# Patient Record
Sex: Female | Born: 1990
Health system: Southern US, Community
[De-identification: ages and names within clinical notes are randomized; demographics above are authoritative.]

## PROBLEM LIST (undated history)

## (undated) ENCOUNTER — Inpatient Hospital Stay (HOSPITAL_COMMUNITY): Payer: Self-pay

## (undated) DIAGNOSIS — C801 Malignant (primary) neoplasm, unspecified: Secondary | ICD-10-CM

## (undated) DIAGNOSIS — K279 Peptic ulcer, site unspecified, unspecified as acute or chronic, without hemorrhage or perforation: Secondary | ICD-10-CM

## (undated) DIAGNOSIS — K6389 Other specified diseases of intestine: Secondary | ICD-10-CM

## (undated) DIAGNOSIS — K635 Polyp of colon: Secondary | ICD-10-CM

## (undated) DIAGNOSIS — Z800 Family history of malignant neoplasm of digestive organs: Secondary | ICD-10-CM

## (undated) HISTORY — PX: HERNIA REPAIR: SHX51

## (undated) HISTORY — PX: TUBAL LIGATION: SHX77

## (undated) HISTORY — DX: Family history of malignant neoplasm of digestive organs: Z80.0

## (undated) HISTORY — DX: Polyp of colon: K63.5

## (undated) HISTORY — PX: APPENDECTOMY: SHX54

## (undated) HISTORY — DX: Other specified diseases of intestine: K63.89

## (undated) HISTORY — PX: ABDOMINAL HYSTERECTOMY: SHX81

## (undated) HISTORY — PX: COLON SURGERY: SHX602

---

## 2012-12-20 ENCOUNTER — Other Ambulatory Visit: Payer: Self-pay

## 2013-06-13 ENCOUNTER — Other Ambulatory Visit (HOSPITAL_COMMUNITY): Payer: Self-pay | Admitting: Specialist

## 2013-06-13 DIAGNOSIS — IMO0002 Reserved for concepts with insufficient information to code with codable children: Secondary | ICD-10-CM

## 2013-06-19 ENCOUNTER — Other Ambulatory Visit (HOSPITAL_COMMUNITY): Payer: Self-pay | Admitting: Specialist

## 2013-06-19 ENCOUNTER — Encounter (HOSPITAL_COMMUNITY): Payer: Self-pay

## 2013-06-19 ENCOUNTER — Ambulatory Visit (HOSPITAL_COMMUNITY)
Admission: RE | Admit: 2013-06-19 | Discharge: 2013-06-19 | Disposition: A | Discharge status: Home / Self Care | Payer: Medicaid Other | Source: Ambulatory Visit | Attending: Specialist | Admitting: Specialist

## 2013-06-19 ENCOUNTER — Inpatient Hospital Stay (HOSPITAL_COMMUNITY)
Admission: AD | Admit: 2013-06-19 | Discharge: 2013-06-19 | Disposition: A | Discharge status: Home / Self Care | Payer: Medicaid Other | Source: Ambulatory Visit | Attending: Obstetrics & Gynecology | Admitting: Obstetrics & Gynecology

## 2013-06-19 VITALS — BP 98/69 | HR 68 | Wt 131.5 lb

## 2013-06-19 DIAGNOSIS — Z3689 Encounter for other specified antenatal screening: Secondary | ICD-10-CM | POA: Insufficient documentation

## 2013-06-19 DIAGNOSIS — IMO0002 Reserved for concepts with insufficient information to code with codable children: Secondary | ICD-10-CM

## 2013-06-19 DIAGNOSIS — O365990 Maternal care for other known or suspected poor fetal growth, unspecified trimester, not applicable or unspecified: Secondary | ICD-10-CM | POA: Insufficient documentation

## 2013-06-19 NOTE — MAU Note (Signed)
Pt in MAU by mistake, has appointment in [place].

## 2013-06-25 ENCOUNTER — Ambulatory Visit (HOSPITAL_COMMUNITY)
Admission: RE | Admit: 2013-06-25 | Discharge: 2013-06-25 | Disposition: A | Discharge status: Home / Self Care | Payer: Medicaid Other | Source: Ambulatory Visit | Attending: Maternal and Fetal Medicine | Admitting: Maternal and Fetal Medicine

## 2013-06-25 DIAGNOSIS — O365990 Maternal care for other known or suspected poor fetal growth, unspecified trimester, not applicable or unspecified: Secondary | ICD-10-CM | POA: Insufficient documentation

## 2013-06-25 DIAGNOSIS — IMO0002 Reserved for concepts with insufficient information to code with codable children: Secondary | ICD-10-CM

## 2013-07-02 ENCOUNTER — Other Ambulatory Visit (HOSPITAL_COMMUNITY): Payer: Self-pay | Admitting: Maternal and Fetal Medicine

## 2013-07-02 DIAGNOSIS — O365990 Maternal care for other known or suspected poor fetal growth, unspecified trimester, not applicable or unspecified: Secondary | ICD-10-CM

## 2013-07-03 ENCOUNTER — Ambulatory Visit (HOSPITAL_COMMUNITY)
Admission: RE | Admit: 2013-07-03 | Discharge: 2013-07-03 | Disposition: A | Discharge status: Home / Self Care | Payer: Medicaid Other | Source: Ambulatory Visit | Attending: Specialist | Admitting: Specialist

## 2013-07-03 DIAGNOSIS — O365990 Maternal care for other known or suspected poor fetal growth, unspecified trimester, not applicable or unspecified: Secondary | ICD-10-CM | POA: Insufficient documentation

## 2014-02-03 ENCOUNTER — Encounter (HOSPITAL_COMMUNITY): Payer: Self-pay

## 2014-04-24 ENCOUNTER — Encounter (HOSPITAL_COMMUNITY): Payer: Self-pay | Admitting: *Deleted

## 2015-08-18 IMAGING — US US OB COMP +14 WK
1 series · 12 of 28 positions shown · non-contrast
Comparison: none

[Series 1: us ob comp +14 wk · 12 of 70 slices shown]
[im 3/70]
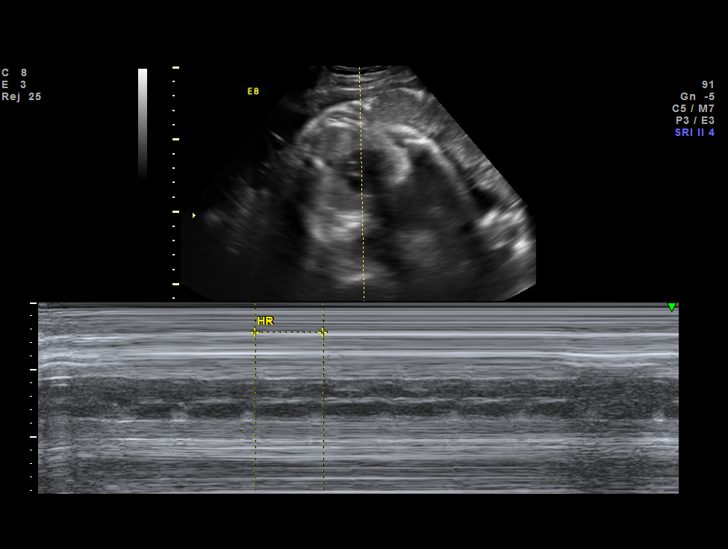
[im 8/70]
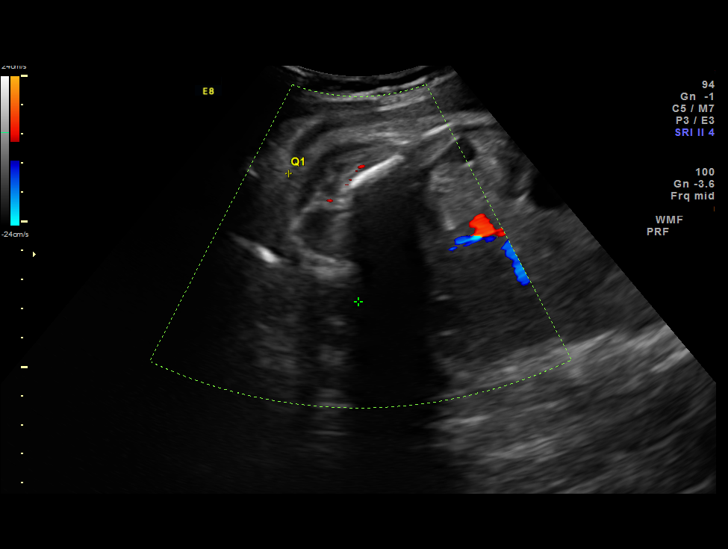
[im 13/70]
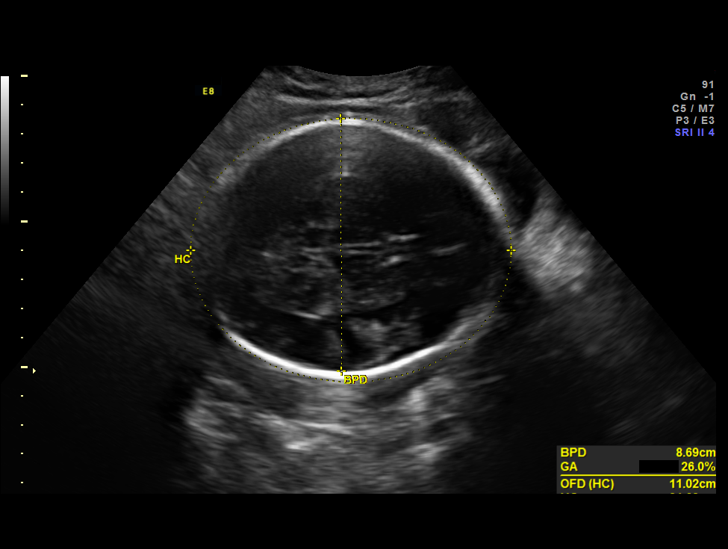
[im 21/70]
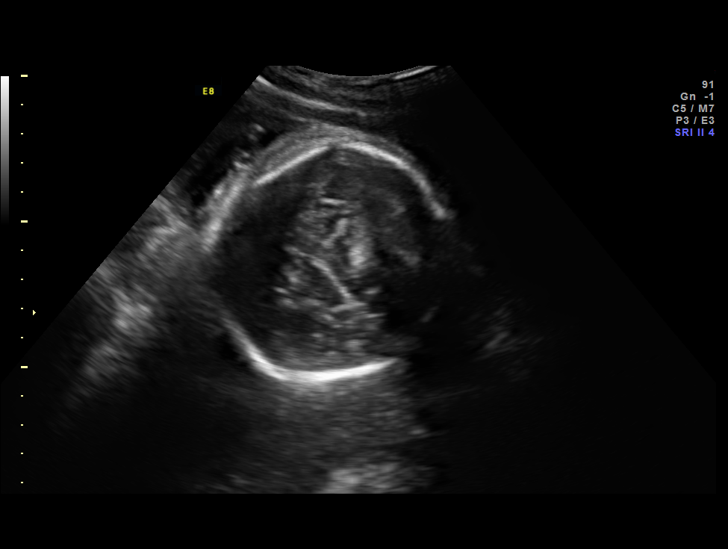
[im 26/70]
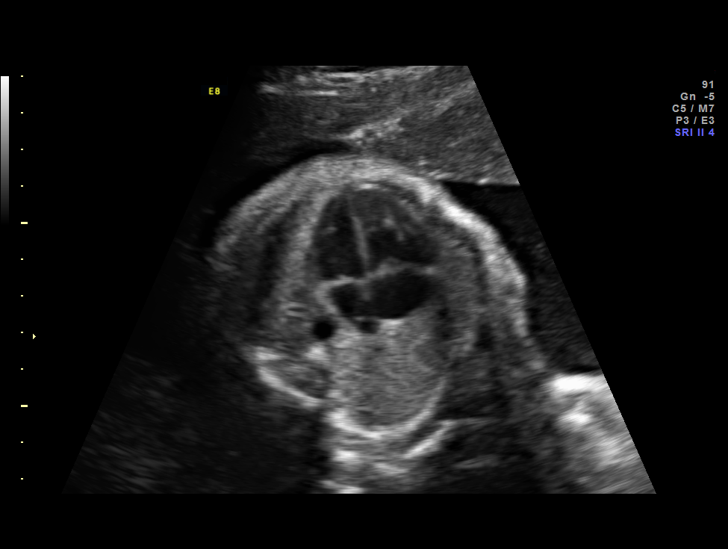
[im 31/70]
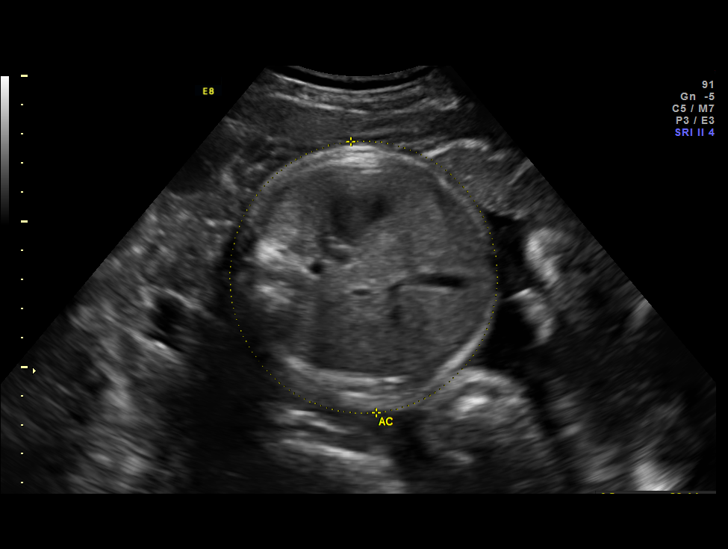
[im 39/70]
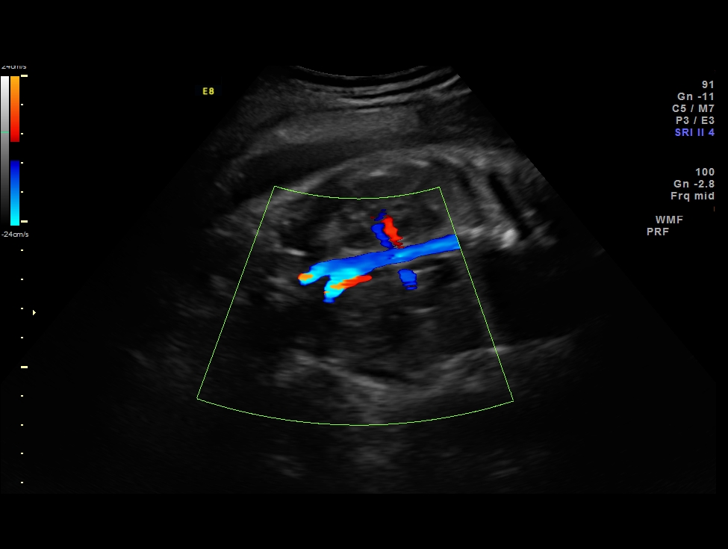
[im 44/70]
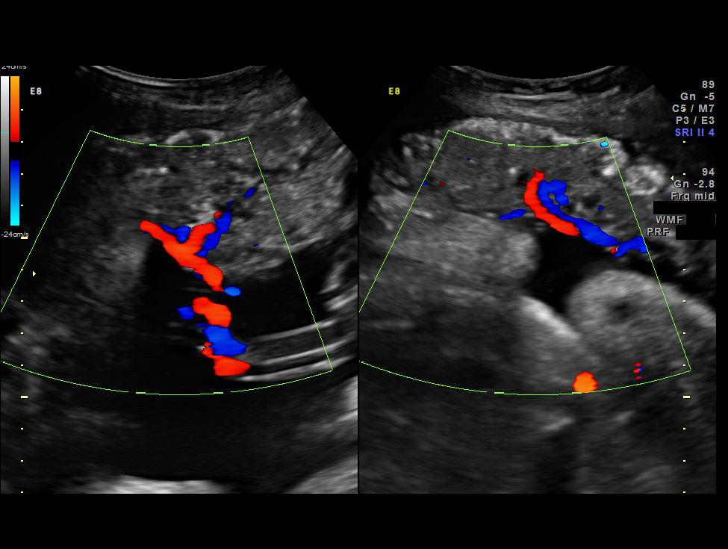
[im 49/70]
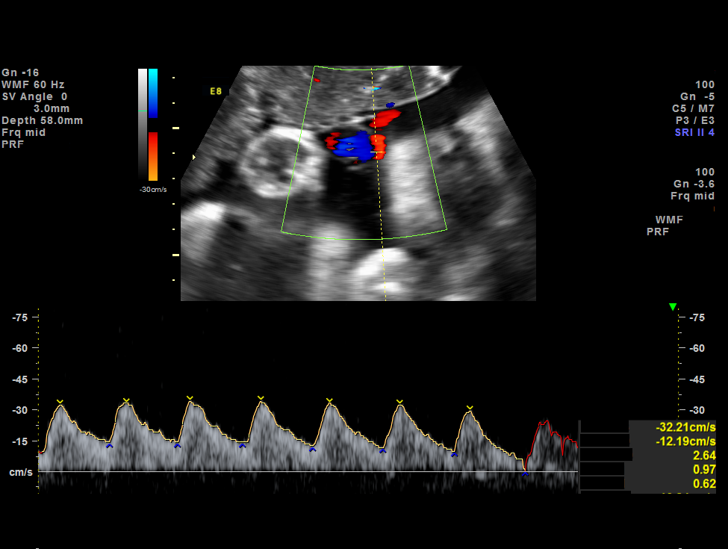
[im 57/70]
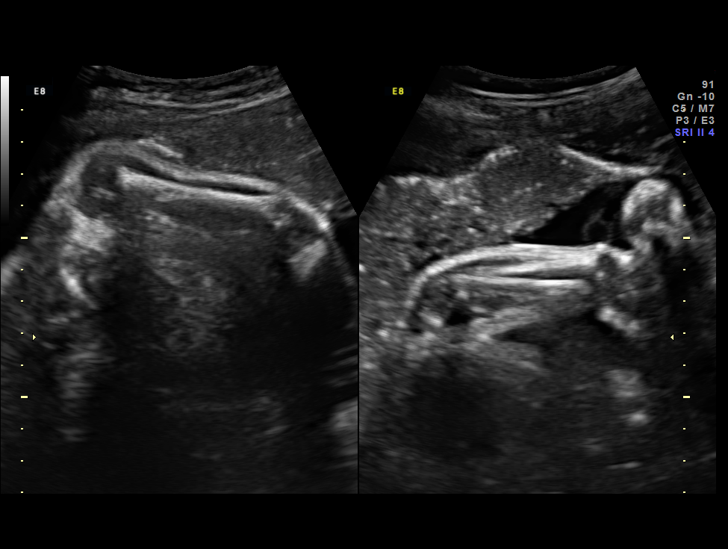
[im 62/70]
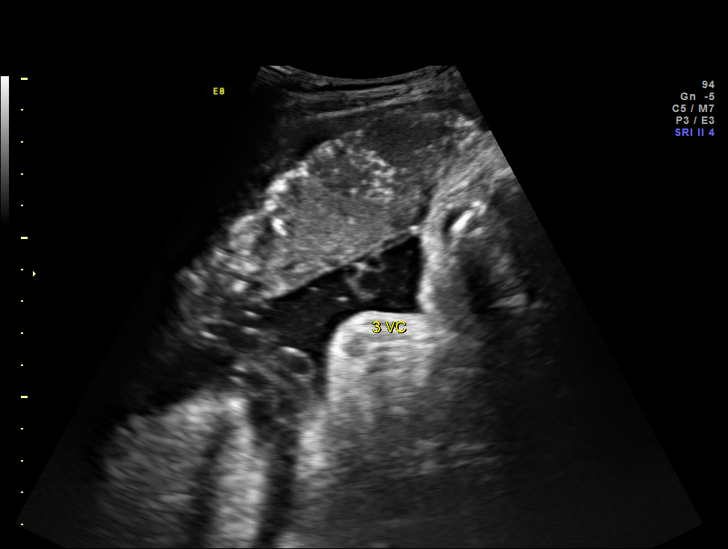
[im 67/70]
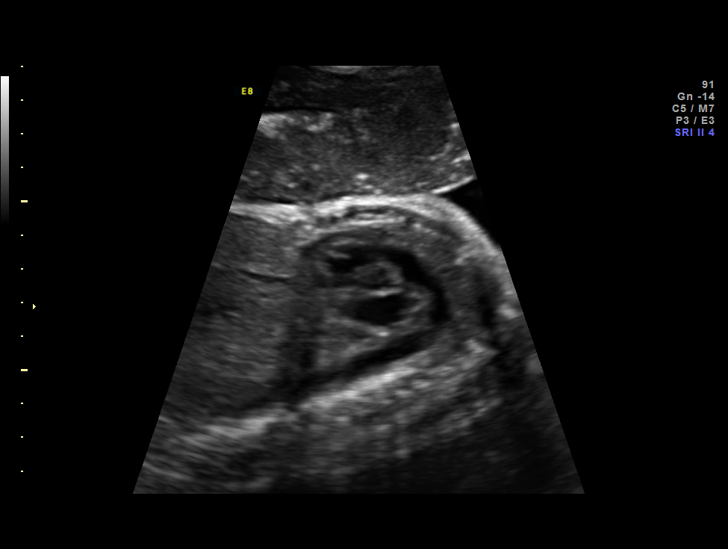

[12 of 28 positions shown; findings below may reference images not displayed]

OBSTETRICS REPORT
                      (Signed Final 06/19/2013 [DATE])

Service(s) Provided

 US OB COMP + 14 WK                                    76805.1
 US UA CORD DOPPLER                                    76820.0
Indications

 Intrauterine Growth Restriction
 Assess fetal well-being
 Basic anatomic survey
Fetal Evaluation

 Num Of Fetuses:    1
 Fetal Heart Rate:  155                          bpm
 Cardiac Activity:  Observed
 Presentation:      Cephalic
 Placenta:          Anterior, above cervical os
 P. Cord            Visualized
 Insertion:

 Amniotic Fluid
 AFI FV:      Subjectively within normal limits
 AFI Sum:     17.76   cm       66  %Tile     Larg Pckt:    9.33  cm
 RUQ:   0.03    cm   RLQ:    3.92   cm    LUQ:   9.33    cm   LLQ:    4.48   cm
Biophysical Evaluation

 Amniotic F.V:   Pocket => 2 cm two         F. Tone:        Observed
                 planes
 F. Movement:    Observed                   Score:          [DATE]
 F. Breathing:   Observed
Biometry

 BPD:     86.9  mm     G. Age:  35w 0d                CI:         79.2   70 - 86
 OFD:    109.7  mm                                    FL/HC:      19.7   20.1 -

 HC:     313.9  mm     G. Age:  35w 1d        7  %    HC/AC:      1.08   0.93 -

 AC:     291.6  mm     G. Age:  33w 1d      < 3  %    FL/BPD:     71.1   71 - 87
 FL:      61.8  mm     G. Age:  32w 0d      < 3  %    FL/AC:      21.2   20 - 24
 HUM:     52.9  mm     G. Age:  30w 6d      < 5  %

 Est. FW:    0611  gm    4 lb 12 oz    < 10  %
Gestational Age

 LMP:           36w 2d        Date:  10/08/12                 EDD:   07/15/13
 U/S Today:     33w 6d                                        EDD:   08/01/13
 Best:          36w 2d     Det. By:  LMP  (10/08/12)          EDD:   07/15/13
Anatomy

 Cranium:          Appears normal         Aortic Arch:      Appears normal
 Fetal Cavum:      Appears normal         Ductal Arch:      Appears normal
 Ventricles:       Appears normal         Diaphragm:        Appears normal
 Choroid Plexus:   Appears normal         Stomach:          Appears normal, left
                                                            sided
 Cerebellum:       Not well visualized    Abdomen:          Appears normal
 Posterior Fossa:  Not well visualized    Abdominal Wall:   Not well visualized
 Nuchal Fold:      Not applicable (>20    Cord Vessels:     Appears normal (3
                   wks GA)                                  vessel cord)
 Face:             Appears normal         Kidneys:          Appear normal
                   (orbits and profile)
 Lips:             Appears normal         Bladder:          Appears normal
 Heart:            Appears normal         Spine:            Not well visualized
                   (4CH, axis, and
                   situs)
 RVOT:             Not well visualized    Lower             Visualized
                                          Extremities:
 LVOT:             Appears normal         Upper             Visualized
                                          Extremities:

 Other:  Fetus appears to be a female. Technically difficult due to advanced
         GA.
Targeted Anatomy

 Fetal Central Nervous System
 Lat. Ventricles:
Doppler - Fetal Vessels

 Umbilical Artery
 S/D:   2.8            70  %tile       RI:
 PI:    0.9                            PSV:       37.28   cm/s
 Umbilical Artery
 Absent DFV:    No     Reverse DFV:    No

Cervix Uterus Adnexa

 Cervix:       Not visualized (advanced GA >34 wks)

 Left Ovary:    Within normal limits.
 Right Ovary:   Within normal limits.
 Adnexa:     No abnormality visualized.
Impression

 Single IUP at 36 [DATE] weeks, confirmed by early ultrasound
 Normal fetal anatomic survey, although somewhat limited due
 to late gestational age
 Fetal growth restriction noted with an EFW < 10th %tile.
 UA Doppler studies normal for gestational age
 BPP [DATE]
 Normal amniotic fluid volume
Recommendations

 Recommend 2x weekly NSTs with weekly AFIs
 Patient to follow up next week for UA Doppler studies
 If testing remains reassuring, recommend delivery at 38
 weeks gestation.

 questions or concerns.

## 2018-03-23 ENCOUNTER — Other Ambulatory Visit: Payer: Self-pay

## 2018-03-23 ENCOUNTER — Emergency Department (HOSPITAL_BASED_OUTPATIENT_CLINIC_OR_DEPARTMENT_OTHER)
Admission: EM | Admit: 2018-03-23 | Discharge: 2018-03-23 | Disposition: A | Discharge status: Home / Self Care | Payer: Self-pay | Attending: Emergency Medicine | Admitting: Emergency Medicine

## 2018-03-23 ENCOUNTER — Encounter (HOSPITAL_BASED_OUTPATIENT_CLINIC_OR_DEPARTMENT_OTHER): Payer: Self-pay | Admitting: *Deleted

## 2018-03-23 DIAGNOSIS — R69 Illness, unspecified: Secondary | ICD-10-CM

## 2018-03-23 DIAGNOSIS — J069 Acute upper respiratory infection, unspecified: Secondary | ICD-10-CM | POA: Insufficient documentation

## 2018-03-23 DIAGNOSIS — B9789 Other viral agents as the cause of diseases classified elsewhere: Secondary | ICD-10-CM

## 2018-03-23 DIAGNOSIS — J111 Influenza due to unidentified influenza virus with other respiratory manifestations: Secondary | ICD-10-CM | POA: Insufficient documentation

## 2018-03-23 MED ORDER — KETOROLAC TROMETHAMINE 30 MG/ML IJ SOLN
30.0000 mg | Freq: Once | INTRAMUSCULAR | Status: AC
  Administered 2018-03-23: 30 mg via INTRAVENOUS
  Filled 2018-03-23: qty 1

## 2018-03-23 MED ORDER — SODIUM CHLORIDE 0.9 % IV BOLUS
1000.0000 mL | Freq: Once | INTRAVENOUS | Status: AC
  Administered 2018-03-23: 1000 mL via INTRAVENOUS

## 2018-03-23 MED ORDER — ACETAMINOPHEN-CODEINE 120-12 MG/5ML PO SOLN: 10.0000 mL | ORAL | 0 refills | Status: DC | PRN

## 2018-03-23 MED ORDER — IBUPROFEN 800 MG PO TABS: 800.0000 mg | ORAL_TABLET | Freq: Three times a day (TID) | ORAL | 0 refills | Status: DC | PRN

## 2018-03-23 MED ORDER — GUAIFENESIN ER 1200 MG PO TB12: 1.0000 | ORAL_TABLET | Freq: Two times a day (BID) | ORAL | 0 refills | Status: DC

## 2018-03-23 MED FILL — IBUPROFEN 800 MG TAB: 800 | 7 days supply | Qty: 21 | Fill #0

## 2018-03-23 MED FILL — SM MUCUS RELIEF 600 MG TB12: 600 | 10 days supply | Qty: 40 | Fill #0

## 2018-03-23 MED FILL — ACETAMINOPHEN-CODEINE ELX: 120-12 | 3 days supply | Qty: 120 | Fill #0

## 2018-03-23 NOTE — ED Provider Notes (Signed)
[facility] EMERGENCY DEPARTMENT Provider Note   CSN: [id number] Arrival date & time: 03/23/18  1018     History   Chief Complaint Chief Complaint  Patient presents with  . Cough    HPI Briana Powers is a 27 y.o. female.  HPI  Patient presents to the emergency department with cough, nasal congestion, body aches, chills and fatigue.  The patient states the symptoms started 3 days ago.  She states that nothing seems to make the condition better or worse.  She states she did not take any medications prior to arrival for her symptoms.  The patient denies chest pain, shortness of breath, headache,blurred vision, neck pain, fever,  weakness, numbness, dizziness, anorexia, edema, abdominal pain, nausea, vomiting, diarrhea, rash, back pain, dysuria, hematemesis, bloody stool, near syncope, or syncope. History reviewed. No pertinent past medical history.  There are no active problems to display for this patient.   History reviewed. No pertinent surgical history.   OB History    Gravida  2   Para  1   Term  1   Preterm  0   AB  0   Living  1     SAB  0   TAB  0   Ectopic  0   Multiple  0   Live Births               Home Medications    Prior to Admission medications   Not on File    Family History History reviewed. No pertinent family history.  Social History Social History   Tobacco Use  . Smoking status: Never Smoker  . Smokeless tobacco: Never Used  Substance Use Topics  . Alcohol use: Not on file  . Drug use: Not on file     Allergies   Patient has no known allergies.   Review of Systems Review of Systems All other systems negative except as documented in the HPI. All pertinent positives and negatives as reviewed in the HPI.  Physical Exam Updated Vital Signs BP 107/70 (BP Location: Left Arm)   Pulse 80   Resp 20   Ht 5\' 5"  (1.651 m)   Wt 50.8 kg   LMP 03/22/2018   SpO2 100%   BMI 18.64 kg/m   Physical Exam Vitals  signs and nursing note reviewed.  Constitutional:      General: She is not in acute distress.    Appearance: She is well-developed.  HENT:     Head: Normocephalic and atraumatic.     Right Ear: Tympanic membrane normal.     Left Ear: Tympanic membrane normal.     Nose: Congestion present.     Mouth/Throat:     Mouth: Mucous membranes are moist.     Pharynx: No oropharyngeal exudate or posterior oropharyngeal erythema.  Eyes:     Pupils: Pupils are equal, round, and reactive to light.  Neck:     Musculoskeletal: Normal range of motion and neck supple.  Cardiovascular:     Rate and Rhythm: Normal rate and regular rhythm.     Heart sounds: Normal heart sounds. No murmur. No friction rub. No gallop.   Pulmonary:     Effort: Pulmonary effort is normal. No respiratory distress.     Breath sounds: Normal breath sounds. No wheezing or rhonchi.  Skin:    General: Skin is warm and dry.     Capillary Refill: Capillary refill takes less than 2 seconds.     Findings: No erythema  or rash.  Neurological:     Mental Status: She is alert and oriented to person, place, and time.     Motor: No abnormal muscle tone.     Coordination: Coordination normal.  Psychiatric:        Behavior: Behavior normal.      ED Treatments / Results  Labs (all labs ordered are listed, but only abnormal results are displayed) Labs Reviewed - No data to display  EKG None  Radiology No results found.  Procedures Procedures (including critical care time)  Medications Ordered in ED Medications  sodium chloride 0.9 % bolus 1,000 mL ( Intravenous Stopped 03/23/18 1326)  ketorolac (TORADOL) 30 MG/ML injection 30 mg (30 mg Intravenous Given 03/23/18 1223)     Initial Impression / Assessment and Plan / ED Course  I have reviewed the triage vital signs and the nursing notes.  Pertinent labs & imaging results that were available during my care of the patient were reviewed by me and considered in my medical  decision making (see chart for details).     Patient be treated for an influenza-like illness.  Told to return here as needed.  Tylenol and Motrin for fever and body aches.  Told to return for any worsening in her condition.  Have advised to follow-up with a primary doctor as well.  Told to increase her fluid intake and rest as much as possible. As much as po able ss   Final diagnoses:  None   To increase her fluid intake and rest ED Discharge Orders    None       Dalia Heading, PA-C 03/23/18 1346    Quintella Reichert, MD 03/23/18 [phone]

## 2018-03-23 NOTE — Discharge Instructions (Addendum)
Return here as needed.  Follow-up with your primary doctor.  Increase your fluid intake.  Rest as much as possible.

## 2018-03-23 NOTE — ED Triage Notes (Signed)
Pt reports cough, congestion x 2 days, body aches also.

## 2019-02-04 ENCOUNTER — Emergency Department (HOSPITAL_BASED_OUTPATIENT_CLINIC_OR_DEPARTMENT_OTHER)
Admission: EM | Admit: 2019-02-04 | Discharge: 2019-02-04 | Disposition: A | Discharge status: Home / Self Care | Payer: 59 | Attending: Emergency Medicine | Admitting: Emergency Medicine

## 2019-02-04 ENCOUNTER — Other Ambulatory Visit: Payer: Self-pay

## 2019-02-04 ENCOUNTER — Encounter (HOSPITAL_BASED_OUTPATIENT_CLINIC_OR_DEPARTMENT_OTHER): Payer: Self-pay

## 2019-02-04 DIAGNOSIS — X58XXXA Exposure to other specified factors, initial encounter: Secondary | ICD-10-CM | POA: Diagnosis not present

## 2019-02-04 DIAGNOSIS — S025XXA Fracture of tooth (traumatic), initial encounter for closed fracture: Secondary | ICD-10-CM | POA: Diagnosis not present

## 2019-02-04 DIAGNOSIS — Y929 Unspecified place or not applicable: Secondary | ICD-10-CM | POA: Diagnosis not present

## 2019-02-04 DIAGNOSIS — Y939 Activity, unspecified: Secondary | ICD-10-CM | POA: Insufficient documentation

## 2019-02-04 DIAGNOSIS — Y999 Unspecified external cause status: Secondary | ICD-10-CM | POA: Diagnosis not present

## 2019-02-04 DIAGNOSIS — K0889 Other specified disorders of teeth and supporting structures: Secondary | ICD-10-CM | POA: Diagnosis present

## 2019-02-04 MED ORDER — PENICILLIN V POTASSIUM 500 MG PO TABS: 500.0000 mg | ORAL_TABLET | Freq: Four times a day (QID) | ORAL | 0 refills | Status: AC

## 2019-02-04 NOTE — ED Provider Notes (Signed)
[facility] EMERGENCY DEPARTMENT Provider Note   CSN: [id number] Arrival date & time: 02/04/19  1931     History   Chief Complaint Chief Complaint  Patient presents with  . Dental Pain    HPI Briana Powers is a 28 y.o. female who presents to the ED today complaining of gradual onset, constant, achy, right lower dental pain x 2 weeks.  She endorses history of fractured tooth in the area which intermittently gives her problems.  She reports that she has been taking 400 mg ibuprofen every 8 hours as needed for pain with mild relief but reports the pain was so severe tonight prompting her to come to the ED.  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  No foul taste in mouth.  Denies fever, chills, trismus, drooling, inability to tolerate secretions, ear pain, any other associated symptoms.      History reviewed. No pertinent past medical history.  There are no active problems to display for this patient.   Past Surgical History:  Procedure Laterality Date  . APPENDECTOMY    . HERNIA REPAIR    . TUBAL LIGATION       OB History    Gravida  2   Para  1   Term  1   Preterm  0   AB  0   Living  1     SAB  0   TAB  0   Ectopic  0   Multiple  0   Live Births               Home Medications    Prior to Admission medications   Medication Sig Start Date End Date Taking? Authorizing Provider  acetaminophen-codeine 120-12 MG/5ML solution Take 10 mLs by mouth every 4 (four) hours as needed for moderate pain. 03/23/18   Lawyer, Harrell Gave, PA-C  Guaifenesin 1200 MG TB12 Take 1 tablet (1,200 mg total) by mouth 2 (two) times daily. 03/23/18   Lawyer, Harrell Gave, PA-C  ibuprofen (ADVIL,MOTRIN) 800 MG tablet Take 1 tablet (800 mg total) by mouth every 8 (eight) hours as needed. 03/23/18   Lawyer, Harrell Gave, PA-C  penicillin v  potassium (VEETID) 500 MG tablet Take 1 tablet (500 mg total) by mouth 4 (four) times daily for 7 days. 02/04/19 02/11/19  Eustaquio Maize, PA-C    Family History History reviewed. No pertinent family history.  Social History Social History   Tobacco Use  . Smoking status: Never Smoker  . Smokeless tobacco: Never Used  Substance Use Topics  . Alcohol use: Not Currently  . Drug use: Not on file     Allergies   Patient has no known allergies.   Review of Systems Review of Systems  Constitutional: Negative for chills and fever.  HENT: Positive for dental problem. Negative for drooling, ear discharge, ear pain and tinnitus.      Physical Exam Updated Vital Signs BP 98/78 (BP Location: Right Arm)   Pulse 82   Temp 98.4 F (36.9 C) (Oral)   Resp 17   Ht 5\' 5"  (1.651 m)   Wt 50.8 kg   SpO2 100%   BMI 18.64 kg/m   Physical Exam Vitals signs and nursing note reviewed.  Constitutional:      Appearance: She is not ill-appearing.  HENT:     Head: Normocephalic and atraumatic.  Comments: Nose clear.  R lower tooth #31 decayed/fractured with caries with  TTP, with minimal surrounding gingival swelling and erythema, no definite abscess, no evidence of ludwig's.  Oropharynx clear and moist, without uvular swelling or deviation, no trismus or drooling, no tonsillar swelling or erythema, no exudates.   Eyes:     Conjunctiva/sclera: Conjunctivae normal.  Cardiovascular:     Rate and Rhythm: Normal rate and regular rhythm.     Pulses: Normal pulses.  Pulmonary:     Effort: Pulmonary effort is normal.     Breath sounds: Normal breath sounds. No rhonchi.  Skin:    General: Skin is warm and dry.     Coloration: Skin is not jaundiced.  Neurological:     Mental Status: She is alert.      ED Treatments / Results  Labs (all labs ordered are listed, but only abnormal results are displayed) Labs Reviewed - No data to display  EKG None  Radiology No results found.   Procedures Procedures (including critical care time)  Medications Ordered in ED Medications - No data to display   Initial Impression / Assessment and Plan / ED Course  I have reviewed the triage vital signs and the nursing notes.  Pertinent labs & imaging results that were available during my care of the patient were reviewed by me and considered in my medical decision making (see chart for details).    28 year old female presents to the ED today complaining of right lower dental pain x 2 weeks.  History of fractured tooth in this area.  No definitive dental abscess to be drained.  No signs of Ludwig's angina.  Vitals are stable today.  Patient afebrile without tachycardia or tachypnea.  Patient has a dentist in the area but is waiting until her insurance kicks in from new job to be seen.  Been taking 400 mg ibuprofen with mild success.  Advised that she can increase this to 600 mg every 6-8 hours as needed for pain.  Will prescribe antibiotics at this time.  Strongly encourage patient to follow-up with her dentist for further evaluation as she likely needs this tooth pulled.  She is in agreement with plan at this time and stable for discharge home.   This note was prepared using Dragon voice recognition software and may include unintentional dictation errors due to the inherent limitations of voice recognition software.       Final Clinical Impressions(s) / ED Diagnoses   Final diagnoses:  Pain, dental  Closed fracture of tooth, initial encounter    ED Discharge Orders         Ordered    penicillin v potassium (VEETID) 500 MG tablet  4 times daily     02/04/19 2043           Eustaquio Maize, PA-C 02/04/19 2047    Fredia Sorrow, MD 02/08/19 1530

## 2019-02-04 NOTE — ED Triage Notes (Signed)
Right lower dental pain x 2 week, tooth is broken pt states she thinks it may be hitting nerve, has hx of same.

## 2019-02-04 NOTE — ED Notes (Signed)
ED Provider at bedside. 

## 2019-02-04 NOTE — Discharge Instructions (Signed)
Please take antibiotics as prescribed You can take 600 mg Ibuprofen every 6-8 hours as needed for pain as well Follow up with your dentist. Attached are other dental resources in the area.  Follow up with OBGYN regarding your pain since your tubal ligation done in 2017.

## 2019-02-04 NOTE — ED Notes (Signed)
EKG on file on 02/04/19 1946 was performed on another patient and entered under this patient's MRN number inadvertently. EKG was not ordered or performed on patient Briana Powers.

## 2019-02-14 ENCOUNTER — Encounter (HOSPITAL_BASED_OUTPATIENT_CLINIC_OR_DEPARTMENT_OTHER): Payer: Self-pay | Admitting: *Deleted

## 2019-02-14 ENCOUNTER — Inpatient Hospital Stay (HOSPITAL_BASED_OUTPATIENT_CLINIC_OR_DEPARTMENT_OTHER)
Admission: EM | Admit: 2019-02-14 | Discharge: 2019-02-23 | DRG: 394 | Disposition: A | Discharge status: Home / Self Care | Payer: 59 | Attending: Internal Medicine | Admitting: Internal Medicine

## 2019-02-14 ENCOUNTER — Other Ambulatory Visit: Payer: Self-pay

## 2019-02-14 DIAGNOSIS — Z800 Family history of malignant neoplasm of digestive organs: Secondary | ICD-10-CM | POA: Diagnosis not present

## 2019-02-14 DIAGNOSIS — Z86010 Personal history of colonic polyps: Secondary | ICD-10-CM | POA: Diagnosis not present

## 2019-02-14 DIAGNOSIS — N83201 Unspecified ovarian cyst, right side: Secondary | ICD-10-CM | POA: Diagnosis not present

## 2019-02-14 DIAGNOSIS — K56691 Other complete intestinal obstruction: Secondary | ICD-10-CM | POA: Diagnosis not present

## 2019-02-14 DIAGNOSIS — K922 Gastrointestinal hemorrhage, unspecified: Secondary | ICD-10-CM

## 2019-02-14 DIAGNOSIS — N949 Unspecified condition associated with female genital organs and menstrual cycle: Secondary | ICD-10-CM

## 2019-02-14 DIAGNOSIS — K317 Polyp of stomach and duodenum: Secondary | ICD-10-CM | POA: Diagnosis not present

## 2019-02-14 DIAGNOSIS — K621 Rectal polyp: Secondary | ICD-10-CM | POA: Diagnosis present

## 2019-02-14 DIAGNOSIS — Z8379 Family history of other diseases of the digestive system: Secondary | ICD-10-CM

## 2019-02-14 DIAGNOSIS — R1084 Generalized abdominal pain: Secondary | ICD-10-CM

## 2019-02-14 DIAGNOSIS — Z8711 Personal history of peptic ulcer disease: Secondary | ICD-10-CM

## 2019-02-14 DIAGNOSIS — D500 Iron deficiency anemia secondary to blood loss (chronic): Secondary | ICD-10-CM | POA: Diagnosis not present

## 2019-02-14 DIAGNOSIS — Z20828 Contact with and (suspected) exposure to other viral communicable diseases: Secondary | ICD-10-CM | POA: Diagnosis not present

## 2019-02-14 DIAGNOSIS — R1013 Epigastric pain: Secondary | ICD-10-CM | POA: Diagnosis present

## 2019-02-14 DIAGNOSIS — D125 Benign neoplasm of sigmoid colon: Principal | ICD-10-CM | POA: Diagnosis present

## 2019-02-14 DIAGNOSIS — R970 Elevated carcinoembryonic antigen [CEA]: Secondary | ICD-10-CM | POA: Diagnosis not present

## 2019-02-14 DIAGNOSIS — D649 Anemia, unspecified: Secondary | ICD-10-CM | POA: Diagnosis not present

## 2019-02-14 HISTORY — DX: Peptic ulcer, site unspecified, unspecified as acute or chronic, without hemorrhage or perforation: K27.9

## 2019-02-14 LAB — URINALYSIS, MICROSCOPIC (REFLEX)

## 2019-02-14 LAB — CBC WITH DIFFERENTIAL/PLATELET
Abs Immature Granulocytes: 0.01 10*3/uL (ref 0.00–0.07)
Basophils Absolute: 0 10*3/uL (ref 0.0–0.1)
Basophils Relative: 1 %
Eosinophils Absolute: 0.2 10*3/uL (ref 0.0–0.5)
Eosinophils Relative: 3 %
HCT: 24.6 % — ABNORMAL LOW (ref 36.0–46.0)
Hemoglobin: 6.4 g/dL — CL (ref 12.0–15.0)
Immature Granulocytes: 0 %
Lymphocytes Relative: 30 %
Lymphs Abs: 1.9 10*3/uL (ref 0.7–4.0)
MCH: 16.7 pg — ABNORMAL LOW (ref 26.0–34.0)
MCHC: 26 g/dL — ABNORMAL LOW (ref 30.0–36.0)
MCV: 64.1 fL — ABNORMAL LOW (ref 80.0–100.0)
Monocytes Absolute: 0.5 10*3/uL (ref 0.1–1.0)
Monocytes Relative: 8 %
Neutro Abs: 3.8 10*3/uL (ref 1.7–7.7)
Neutrophils Relative %: 58 %
Platelets: 972 10*3/uL (ref 150–400)
RBC: 3.84 MIL/uL — ABNORMAL LOW (ref 3.87–5.11)
RDW: 22.9 % — ABNORMAL HIGH (ref 11.5–15.5)
Smear Review: INCREASED
WBC: 6.5 10*3/uL (ref 4.0–10.5)
nRBC: 0 % (ref 0.0–0.2)

## 2019-02-14 LAB — COMPREHENSIVE METABOLIC PANEL
ALT: 15 U/L (ref 0–44)
AST: 17 U/L (ref 15–41)
Albumin: 3.8 g/dL (ref 3.5–5.0)
Alkaline Phosphatase: 35 U/L — ABNORMAL LOW (ref 38–126)
Anion gap: 7 (ref 5–15)
BUN: 14 mg/dL (ref 6–20)
CO2: 26 mmol/L (ref 22–32)
Calcium: 9.2 mg/dL (ref 8.9–10.3)
Chloride: 105 mmol/L (ref 98–111)
Creatinine, Ser: 0.99 mg/dL (ref 0.44–1.00)
GFR calc Af Amer: >60 mL/min (ref >60)
GFR calc non Af Amer: >60 mL/min (ref >60)
Glucose, Bld: 94 mg/dL (ref 70–99)
Potassium: 3.7 mmol/L (ref 3.5–5.1)
Sodium: 138 mmol/L (ref 135–145)
Total Bilirubin: 0.7 mg/dL (ref 0.3–1.2)
Total Protein: 7.6 g/dL (ref 6.5–8.1)

## 2019-02-14 LAB — WET PREP, GENITAL
Sperm: NONE SEEN
Trich, Wet Prep: NONE SEEN
Yeast Wet Prep HPF POC: NONE SEEN

## 2019-02-14 LAB — URINALYSIS, ROUTINE W REFLEX MICROSCOPIC
Bilirubin Urine: NEGATIVE
Glucose, UA: NEGATIVE mg/dL
Hgb urine dipstick: NEGATIVE
Ketones, ur: NEGATIVE mg/dL
Nitrite: NEGATIVE
Protein, ur: NEGATIVE mg/dL
Specific Gravity, Urine: 1.01 (ref 1.005–1.030)
pH: 7.5 (ref 5.0–8.0)

## 2019-02-14 LAB — LIPASE, BLOOD: Lipase: 41 U/L (ref 11–51)

## 2019-02-14 LAB — PREGNANCY, URINE: Preg Test, Ur: NEGATIVE

## 2019-02-14 LAB — OCCULT BLOOD X 1 CARD TO LAB, STOOL: Fecal Occult Bld: POSITIVE — AB

## 2019-02-14 MED ORDER — PANTOPRAZOLE SODIUM 40 MG IV SOLR
INTRAVENOUS | Status: AC
  Administered 2019-02-14: 23:00:00
  Filled 2019-02-14: qty 160

## 2019-02-14 MED ORDER — ACETAMINOPHEN 500 MG PO TABS
1000.0000 mg | ORAL_TABLET | Freq: Once | ORAL | Status: AC
  Administered 2019-02-14: 1000 mg via ORAL
  Filled 2019-02-14: qty 2

## 2019-02-14 MED ORDER — SODIUM CHLORIDE 0.9 % IV SOLN
80.0000 mg | Freq: Once | INTRAVENOUS | Status: AC
  Administered 2019-02-14: 80 mg via INTRAVENOUS
  Filled 2019-02-14: qty 80

## 2019-02-14 MED ORDER — CHLORHEXIDINE GLUCONATE CLOTH 2 % EX PADS: 6.0000 | MEDICATED_PAD | Freq: Every day | CUTANEOUS | Status: DC

## 2019-02-14 MED ORDER — ACETAMINOPHEN 325 MG PO TABS
650.0000 mg | ORAL_TABLET | Freq: Once | ORAL | Status: AC
  Administered 2019-02-15: 650 mg via ORAL
  Filled 2019-02-14: qty 2

## 2019-02-14 MED ORDER — SODIUM CHLORIDE 0.9 % IV SOLN
8.0000 mg/h | INTRAVENOUS | Status: AC
  Administered 2019-02-14 – 2019-02-17 (×8): 8 mg/h via INTRAVENOUS
  Filled 2019-02-14 (×8): qty 80

## 2019-02-14 MED ORDER — DIPHENHYDRAMINE HCL 50 MG/ML IJ SOLN
25.0000 mg | Freq: Once | INTRAMUSCULAR | Status: AC
  Administered 2019-02-15: 25 mg via INTRAVENOUS
  Filled 2019-02-14: qty 1

## 2019-02-14 MED ORDER — SODIUM CHLORIDE 0.9% IV SOLUTION
Freq: Once | INTRAVENOUS | Status: AC
  Administered 2019-02-15: 01:00:00 via INTRAVENOUS

## 2019-02-14 NOTE — ED Notes (Signed)
ED Provider at bedside. 

## 2019-02-14 NOTE — ED Notes (Addendum)
Date and time results received: 02/14/19  2015time)  Test:HEmoglobin Critical Value: 6.4  Name of Provider Notified: Madilyn Hook, PA-C  Orders Received? Or Actions Taken?:

## 2019-02-14 NOTE — Plan of Care (Signed)
28 yo female with hx of UGI, presents with epigastric pain. Hgb 6.4  FOBT +  I requested that ED speak with GI to consult them if they truly suspect this is an UGI bleed.

## 2019-02-14 NOTE — ED Notes (Signed)
Date and time results received: 02/14/19 2010 (use smartphrase ".now" to insert current time)  Test: hemoglobin and platelet count  Critical Value: 6.4 and 972  Name of Provider Notified: [place]  Orders Received? Or Actions Taken?: Orders Received - See Orders for details

## 2019-02-14 NOTE — ED Notes (Signed)
[facility] for records of pt endoscopy and colonscopy  [facility] stated that they could not release as they were done at [facility]

## 2019-02-14 NOTE — ED Provider Notes (Signed)
[facility] EMERGENCY DEPARTMENT Provider Note   CSN: [id number] Arrival date & time: 02/14/19  1851     History   Chief Complaint Chief Complaint  Patient presents with  . Abdominal Pain    HPI Briana Powers is a 28 y.o. female.     Is a 28 year old female with past medical history of PID presenting to the emergency department for abdominal pain.  Patient reports is been going on for 2 days.  Reports that it gets worse when she tries to stand up and walk and better with rest.  She denies any dysuria, vaginal discharge, vaginal bleeding, back pain, flank pain, dysuria, hematuria, nausea, vomiting, diarrhea.  Last bowel movement was just prior to arrival and it was normal per her.  She has not tried anything for relief.     History reviewed. No pertinent past medical history.  There are no active problems to display for this patient.   Past Surgical History:  Procedure Laterality Date  . APPENDECTOMY    . HERNIA REPAIR    . TUBAL LIGATION       OB History    Gravida  2   Para  1   Term  1   Preterm  0   AB  0   Living  1     SAB  0   TAB  0   Ectopic  0   Multiple  0   Live Births               Home Medications    Prior to Admission medications   Medication Sig Start Date End Date Taking? Authorizing Provider  acetaminophen-codeine 120-12 MG/5ML solution Take 10 mLs by mouth every 4 (four) hours as needed for moderate pain. 03/23/18   Lawyer, Harrell Gave, PA-C  Guaifenesin 1200 MG TB12 Take 1 tablet (1,200 mg total) by mouth 2 (two) times daily. 03/23/18   Lawyer, Harrell Gave, PA-C  ibuprofen (ADVIL,MOTRIN) 800 MG tablet Take 1 tablet (800 mg total) by mouth every 8 (eight) hours as needed. 03/23/18   Dalia Heading, PA-C    Family History No family history on file.  Social History Social History   Tobacco Use  . Smoking status: Never Smoker  . Smokeless tobacco: Never Used  Substance Use Topics  . Alcohol use: Not  Currently  . Drug use: Not on file     Allergies   Patient has no known allergies.   Review of Systems Review of Systems  Constitutional: Negative for chills and fever.  HENT: Negative for congestion.   Respiratory: Negative for cough and shortness of breath.   Cardiovascular: Negative for chest pain.  Gastrointestinal: Positive for abdominal pain. Negative for abdominal distention, anal bleeding, blood in stool, constipation, diarrhea, nausea, rectal pain and vomiting.  Genitourinary: Negative for decreased urine volume, dysuria, flank pain, frequency, hematuria, menstrual problem, pelvic pain, urgency, vaginal bleeding, vaginal discharge and vaginal pain.  Musculoskeletal: Negative for back pain.  Skin: Negative for rash.  Neurological: Negative for dizziness and light-headedness.     Physical Exam Updated Vital Signs BP 111/69 (BP Location: Left Arm)   Pulse 90   Temp 98.9 F (37.2 C) (Oral)   Resp 14   Ht 5\' 5"  (1.651 m)   Wt 50.8 kg   LMP 01/09/2019   SpO2 100%   BMI 18.64 kg/m   Physical Exam Vitals signs and nursing note reviewed. Exam conducted with a chaperone present.  Constitutional:      General:  She is not in acute distress.    Appearance: Normal appearance. She is well-developed. She is not ill-appearing, toxic-appearing or diaphoretic.  HENT:     Head: Normocephalic.  Eyes:     Conjunctiva/sclera: Conjunctivae normal.  Cardiovascular:     Rate and Rhythm: Normal rate and regular rhythm.     Heart sounds: No murmur.  Pulmonary:     Effort: Pulmonary effort is normal.  Abdominal:     General: Bowel sounds are normal.     Palpations: Abdomen is soft. There is no hepatomegaly or splenomegaly.     Tenderness: There is abdominal tenderness (Very mild). There is no right CVA tenderness, left CVA tenderness, guarding or rebound. Negative signs include Murphy's sign.  Genitourinary:    Vagina: Normal.     Cervix: Normal.     Uterus: Normal.       Adnexa: Right adnexa normal and left adnexa normal.     Rectum: Guaiac result positive. No mass or tenderness.  Skin:    General: Skin is dry.  Neurological:     Mental Status: She is alert.  Psychiatric:        Mood and Affect: Mood normal.      ED Treatments / Results  Labs (all labs ordered are listed, but only abnormal results are displayed) Labs Reviewed  PREGNANCY, URINE  URINALYSIS, ROUTINE W REFLEX MICROSCOPIC    EKG None  Radiology No results found.  Procedures Procedures (including critical care time)  Medications Ordered in ED Medications - No data to display   Initial Impression / Assessment and Plan / ED Course  I have reviewed the triage vital signs and the nursing notes.  Pertinent labs & imaging results that were available during my care of the patient were reviewed by me and considered in my medical decision making (see chart for details).  Clinical Course as of Feb 14 2235  Thu Feb 14, 2019  2051 I spoke with Dr. Maudie Mercury hospitalist who will admit for GI bleed. Dr. Maudie Mercury would like me to consult GI at this time as well. Patient agrees to admission for acute GI bleed with hemoglobin of 6.4 requiring transfusion. Patient currently stable with BP 111/69.   [KM]  2111 Spoke with Dr. Watt Climes GI who did not have anything additional to add to treatment at this time. He would like to see if we can obtain previous EGD and colonoscopy results. He also feels patient may benefit more from hematologist than GI given her iron deficiency and heavy menstrual cycles. He will continue to follow   [KM]    Clinical Course User Index [KM] Alveria Apley, PA-C       CRITICAL CARE Performed by: Alveria Apley   Total critical care time: 35 minutes  Critical care time was exclusive of separately billable procedures and treating other patients.  Critical care was necessary to treat or prevent imminent or life-threatening deterioration.  Critical care was time spent  personally by me on the following activities: development of treatment plan with patient and/or surrogate as well as nursing, discussions with consultants, evaluation of patient's response to treatment, examination of patient, obtaining history from patient or surrogate, ordering and performing treatments and interventions, ordering and review of laboratory studies, ordering and review of radiographic studies, pulse oximetry and re-evaluation of patient's condition.   Final Clinical Impressions(s) / ED Diagnoses   Final diagnoses:  None    ED Discharge Orders    None  Alveria Apley, PA-C 02/14/19 2237    Lennice Sites, DO 02/14/19 2245

## 2019-02-14 NOTE — ED Triage Notes (Signed)
Lower abdominal pain. No diarrhea or vomiting.

## 2019-02-15 ENCOUNTER — Encounter (HOSPITAL_COMMUNITY): Payer: Self-pay | Admitting: Internal Medicine

## 2019-02-15 ENCOUNTER — Observation Stay (HOSPITAL_COMMUNITY): Payer: 59

## 2019-02-15 DIAGNOSIS — D649 Anemia, unspecified: Secondary | ICD-10-CM | POA: Diagnosis not present

## 2019-02-15 LAB — MRSA PCR SCREENING: MRSA by PCR: NEGATIVE

## 2019-02-15 LAB — COMPREHENSIVE METABOLIC PANEL
ALT: 13 U/L (ref 0–44)
AST: 13 U/L — ABNORMAL LOW (ref 15–41)
Albumin: 3.3 g/dL — ABNORMAL LOW (ref 3.5–5.0)
Alkaline Phosphatase: 28 U/L — ABNORMAL LOW (ref 38–126)
Anion gap: 8 (ref 5–15)
BUN: 12 mg/dL (ref 6–20)
CO2: 22 mmol/L (ref 22–32)
Calcium: 8.6 mg/dL — ABNORMAL LOW (ref 8.9–10.3)
Chloride: 107 mmol/L (ref 98–111)
Creatinine, Ser: 0.86 mg/dL (ref 0.44–1.00)
GFR calc Af Amer: >60 mL/min (ref >60)
GFR calc non Af Amer: >60 mL/min (ref >60)
Glucose, Bld: 84 mg/dL (ref 70–99)
Potassium: 4 mmol/L (ref 3.5–5.1)
Sodium: 137 mmol/L (ref 135–145)
Total Bilirubin: 0.8 mg/dL (ref 0.3–1.2)
Total Protein: 6.3 g/dL — ABNORMAL LOW (ref 6.5–8.1)

## 2019-02-15 LAB — CBC
HCT: 26 % — ABNORMAL LOW (ref 36.0–46.0)
Hemoglobin: 7.2 g/dL — ABNORMAL LOW (ref 12.0–15.0)
MCH: 18.8 pg — ABNORMAL LOW (ref 26.0–34.0)
MCHC: 27.7 g/dL — ABNORMAL LOW (ref 30.0–36.0)
MCV: 67.9 fL — ABNORMAL LOW (ref 80.0–100.0)
Platelets: 762 10*3/uL — ABNORMAL HIGH (ref 150–400)
RBC: 3.83 MIL/uL — ABNORMAL LOW (ref 3.87–5.11)
RDW: 25.1 % — ABNORMAL HIGH (ref 11.5–15.5)
WBC: 7.8 10*3/uL (ref 4.0–10.5)
nRBC: 0 % (ref 0.0–0.2)

## 2019-02-15 LAB — IRON AND TIBC
Iron: 8 ug/dL — ABNORMAL LOW (ref 28–170)
Saturation Ratios: 2 % — ABNORMAL LOW (ref 10.4–31.8)
TIBC: 475 ug/dL — ABNORMAL HIGH (ref 250–450)
UIBC: 467 ug/dL

## 2019-02-15 LAB — PROTIME-INR
INR: 1.1 (ref 0.8–1.2)
Prothrombin Time: 14 seconds (ref 11.4–15.2)

## 2019-02-15 LAB — HEMOGLOBIN AND HEMATOCRIT, BLOOD
HCT: 32.1 % — ABNORMAL LOW (ref 36.0–46.0)
HCT: 33.5 % — ABNORMAL LOW (ref 36.0–46.0)
Hemoglobin: 9 g/dL — ABNORMAL LOW (ref 12.0–15.0)
Hemoglobin: 9.5 g/dL — ABNORMAL LOW (ref 12.0–15.0)

## 2019-02-15 LAB — FERRITIN: Ferritin: 1 ng/mL — ABNORMAL LOW (ref 11–307)

## 2019-02-15 LAB — HIV ANTIBODY (ROUTINE TESTING W REFLEX): HIV Screen 4th Generation wRfx: NONREACTIVE

## 2019-02-15 LAB — ABO/RH: ABO/RH(D): A POS

## 2019-02-15 LAB — PREPARE RBC (CROSSMATCH)

## 2019-02-15 LAB — SARS CORONAVIRUS 2 (TAT 6-24 HRS): SARS Coronavirus 2: NEGATIVE

## 2019-02-15 LAB — VITAMIN B12: Vitamin B-12: 285 pg/mL (ref 180–914)

## 2019-02-15 MED ORDER — IOHEXOL 300 MG/ML  SOLN
100.0000 mL | Freq: Once | INTRAMUSCULAR | Status: AC | PRN
  Administered 2019-02-15: 100 mL via INTRAVENOUS

## 2019-02-15 MED ORDER — SODIUM CHLORIDE (PF) 0.9 % IJ SOLN
INTRAMUSCULAR | Status: AC
  Filled 2019-02-15: qty 50

## 2019-02-15 MED ORDER — BARIUM SULFATE 0.1 % PO SUSP
ORAL | Status: AC
  Filled 2019-02-15: qty 3

## 2019-02-15 MED ORDER — TRAMADOL HCL 50 MG PO TABS
50.0000 mg | ORAL_TABLET | Freq: Four times a day (QID) | ORAL | Status: DC | PRN
  Administered 2019-02-15 – 2019-02-21 (×8): 50 mg via ORAL
  Filled 2019-02-15 (×8): qty 1

## 2019-02-15 NOTE — Progress Notes (Signed)
PROGRESS NOTE                                                                                                                                                                                                             Patient Demographics:    Briana Powers, is a 28 y.o. female, DOB - 07-12-1990, [id number]  Admit date - 02/14/2019   Admitting Physician Jani Gravel, MD  Outpatient Primary MD for the patient is Patient, No Pcp Per  LOS - 0   Chief Complaint  Patient presents with  . Abdominal Pain       Brief Narrative    28 year old female to ED secondary to complaints of abdominal pain, weakness, she was found to have hemoglobin of 6.4, patient received 2 units PRBC with good response, patient with recent hospitalization at [facility] for microcytic anemia with Hemoccult positive stool, EGD and colonoscopy were held secondary to poor prep, but she was noted to have multiple polyps and there was a concern of genetic syndrome such as FAP, she did not follow-up with them as she lost her insurance, now patient has new insurance,    Subjective:    Briana Powers today to report generalized weakness, fatigue, and some mild dyspnea, denies any fever or chest pain   Assessment  & Plan :    Active Problems:   Symptomatic anemia   Symptomatic anemia/chronic blood loss anemia -Patient presents with epigastric abdominal pain, reports endoscopy last May significant for peptic ulcer disease, continue with Protonix drip, monitor H&H closely and transfuse as needed. -Hemoglobin 6.4 on presentation, received 2 units PRBC, good response, hemoglobin of 9, continue to monitor every 6 hours -Clear liquid diet -Keep on Protonix. -Colonoscopy at [facility] was poor prep, but was significant for multiple polyps, there is a concern of genetic syndrome such as FAP, GI input greatly appreciated, plan for CT enterography with IV contrast for  further evaluation and to rule out any small bowel lesions given recent finding of adenomatous mucosa in the duodenum.    COVID-19 Labs  Recent Labs    02/15/19 0009  FERRITIN 1*    No results found for: SARSCOV2NAA   Code Status : Full  Family Communication  : Cussed with patient  Disposition Plan  : Home  Barriers For Discharge : Patient with  history of recurrent GI bleed in the past, will need close monitoring for her hemoglobin, H&H every 6 hours, remains on Protonix drip given history of peptic ulcer disease, will need CT intra-abdominal pelvis with contrast for further evaluation as well.  Consults  : GI  Procedures  : 2 units PRBC transfusion  DVT Prophylaxis  : SCD  Lab Results  Component Value Date   PLT 762 (H) 02/15/2019    Antibiotics  :    Anti-infectives (From admission, onward)   None        Objective:   Vitals:   02/15/19 0800 02/15/19 0833 02/15/19 0900 02/15/19 1000  BP: 100/60  103/67 112/86  Pulse: 69  67 84  Resp: 13  15 12   Temp:  98.3 F (36.8 C)    TempSrc:  Oral    SpO2: 100%  100% 100%  Weight:      Height:        Wt Readings from Last 3 Encounters:  02/14/19 50.8 kg  02/04/19 50.8 kg  03/23/18 50.8 kg     Intake/Output Summary (Last 24 hours) at 02/15/2019 1137 Last data filed at 02/15/2019 1000 Gross per 24 hour  Intake 1626.42 ml  Output -  Net 1626.42 ml     Physical Exam  Awake Alert, Oriented X 3, No new F.N deficits, Normal affect, frail Symmetrical Chest wall movement, Good air movement bilaterally, CTAB RRR,No Gallops,Rubs or new Murmurs, No Parasternal Heave +ve B.Sounds, Abd Soft, has mild left lower quadrant tenderness ,no rebound - guarding or rigidity. No Cyanosis, Clubbing or edema, No new Rash or bruise    Patient was seen and examined with female chaperone CNA Diane    Data Review:    CBC Recent Labs  Lab 02/14/19 1926 02/15/19 0407 02/15/19 1009  WBC 6.5 7.8  --   HGB 6.4* 7.2*  9.0*  HCT 24.6* 26.0* 32.1*  PLT 972* 762*  --   MCV 64.1* 67.9*  --   MCH 16.7* 18.8*  --   MCHC 26.0* 27.7*  --   RDW 22.9* 25.1*  --   LYMPHSABS 1.9  --   --   MONOABS 0.5  --   --   EOSABS 0.2  --   --   BASOSABS 0.0  --   --     Chemistries  Recent Labs  Lab 02/14/19 1926 02/15/19 0407  NA 138 137  K 3.7 4.0  CL 105 107  CO2 26 22  GLUCOSE 94 84  BUN 14 12  CREATININE 0.99 0.86  CALCIUM 9.2 8.6*  AST 17 13*  ALT 15 13  ALKPHOS 35* 28*  BILITOT 0.7 0.8   ------------------------------------------------------------------------------------------------------------------ No results for input(s): CHOL, HDL, LDLCALC, TRIG, CHOLHDL, LDLDIRECT in the last 72 hours.  No results found for: HGBA1C ------------------------------------------------------------------------------------------------------------------ No results for input(s): TSH, T4TOTAL, T3FREE, THYROIDAB in the last 72 hours.  Invalid input(s): FREET3 ------------------------------------------------------------------------------------------------------------------ Recent Labs    02/15/19 0009  VITAMINB12 285  FERRITIN 1*  TIBC 475*  IRON 8*    Coagulation profile Recent Labs  Lab 02/15/19 0407  INR 1.1    No results for input(s): DDIMER in the last 72 hours.  Cardiac Enzymes No results for input(s): CKMB, TROPONINI, MYOGLOBIN in the last 168 hours.  Invalid input(s): CK ------------------------------------------------------------------------------------------------------------------ No results found for: BNP  Inpatient Medications  Scheduled Meds: . Chlorhexidine Gluconate Cloth  6 each Topical Daily   Continuous Infusions: . pantoprozole (PROTONIX) infusion 8 mg/hr (02/15/19 1000)  PRN Meds:.traMADol  Micro Results Recent Results (from the past 240 hour(s))  Wet prep, genital     Status: Abnormal   Collection Time: 02/14/19  8:11 PM   Specimen: Cervix  Result Value Ref Range  Status   Yeast Wet Prep HPF POC NONE SEEN NONE SEEN Final   Trich, Wet Prep NONE SEEN NONE SEEN Final   Clue Cells Wet Prep HPF POC PRESENT (A) NONE SEEN Final   WBC, Wet Prep HPF POC MANY (A) NONE SEEN Final   Sperm NONE SEEN  Final    Comment: Performed at [facility], [address] [place], [place] [address]  MRSA PCR Screening     Status: None   Collection Time: 02/14/19 11:27 PM   Specimen: Nasal Mucosa; Nasopharyngeal  Result Value Ref Range Status   MRSA by PCR NEGATIVE NEGATIVE Final    Comment:        The GeneXpert MRSA Assay (FDA approved for NASAL specimens only), is one component of a comprehensive MRSA colonization surveillance program. It is not intended to diagnose MRSA infection nor to guide or monitor treatment for MRSA infections. Performed at [facility], [place] [address], [place], [place] [address]     Radiology Reports No results found.    Phillips Climes M.D on 02/15/2019 at 11:37 AM  Between 7am to 7pm - Pager - [phone]  After 7pm go to [email or web address] - password [facility]  [facility] -  Office  [phone]

## 2019-02-15 NOTE — Consult Note (Signed)
Referring Provider:  [facility] Primary Care Physician:  Patient, No Pcp Per Primary Gastroenterologist: [facility]  Reason for Consultation: Symptomatic anemia  HPI: Briana Powers is a 28 y.o. female admitted to the hospital with abdominal pain and symptomatic anemia.  Hemoglobin 6.4 on admission.  It was in the range of 9 earlier this year.  GI is consulted for further evaluation.  Please see previous GI work-up as mentioned below  Patient seen and examined at bedside.  She was having intermittent bright red blood per rectum for last few months but it has stopped around 1 month ago.  She continues to have heavy menstruation.  She is complaining of intermittent epigastric abdominal pain for last 1 to 2 months.  Denies any associated nausea or vomiting.  Denies any diarrhea or constipation.  Previous GI work-up Patient is currently followed by [facility] for her microcytic anemia and FOBT positive.  She underwent EGD and colonoscopy in May 2020.  Looks like, colonoscopy was aborted because of poor prep.  She was also found to have multiple polyps and there were concern for genetic syndrome such as FAP.  I do not have full procedure report available to review but based on care everywhere review, it looks like she had multiple tubular adenomas in the colon and duodenal biopsies also showed adenomas.  She was referred for genetic counseling as well as EUS for duodenal surveillance and for targeted biopsies.  Family history of colon cancer in the mother who died in her 58s.  Patient's twin brother also required partial colectomy for reasons other than cancer.    Past Medical History:  Diagnosis Date  . PUD (peptic ulcer disease)     Past Surgical History:  Procedure Laterality Date  . APPENDECTOMY    . HERNIA REPAIR    . TUBAL LIGATION      Prior to Admission medications   Not on File    Scheduled Meds: . Chlorhexidine Gluconate Cloth  6 each Topical Daily    Continuous Infusions: . pantoprozole (PROTONIX) infusion 8 mg/hr (02/15/19 0548)   PRN Meds:.traMADol  Allergies as of 02/14/2019  . (No Known Allergies)    Family History  Problem Relation Age of Onset  . Colon cancer Mother     Social History   Socioeconomic History  . Marital status: Single    Spouse name: Not on file  . Number of children: Not on file  . Years of education: Not on file  . Highest education level: Not on file  Occupational History  . Not on file  Social Needs  . Financial resource strain: Not on file  . Food insecurity    Worry: Not on file    Inability: Not on file  . Transportation needs    Medical: Not on file    Non-medical: Not on file  Tobacco Use  . Smoking status: Never Smoker  . Smokeless tobacco: Never Used  Substance and Sexual Activity  . Alcohol use: Not Currently  . Drug use: Not on file  . Sexual activity: Not on file  Lifestyle  . Physical activity    Days per week: Not on file    Minutes per session: Not on file  . Stress: Not on file  Relationships  . Social Herbalist on phone: Not on file    Gets together: Not on file    Attends religious service: Not on file    Active member of club or organization:  Not on file    Attends meetings of clubs or organizations: Not on file    Relationship status: Not on file  . Intimate partner violence    Fear of current or ex partner: Not on file    Emotionally abused: Not on file    Physically abused: Not on file    Forced sexual activity: Not on file  Other Topics Concern  . Not on file  Social History Narrative  . Not on file    Review of Systems: Review of Systems  Constitutional: Positive for malaise/fatigue. Negative for chills and fever.  HENT: Negative for hearing loss and tinnitus.   Eyes: Negative for blurred vision and double vision.  Respiratory: Negative for cough and hemoptysis.   Cardiovascular: Negative for chest pain and palpitations.   Gastrointestinal: Negative for abdominal pain, constipation, diarrhea, heartburn, nausea and vomiting.  Genitourinary: Negative for dysuria and urgency.  Musculoskeletal: Negative for myalgias and neck pain.  Skin: Negative for itching and rash.  Neurological: Negative for seizures and loss of consciousness.  Endo/Heme/Allergies: Bruises/bleeds easily.  Psychiatric/Behavioral: Negative for suicidal ideas. The patient is not nervous/anxious.     Physical Exam: Vital signs: Vitals:   02/15/19 0900 02/15/19 1000  BP: 103/67 112/86  Pulse: 67 84  Resp: 15 12  Temp:    SpO2: 100% 100%   Last BM Date: 02/14/19 Physical Exam  Constitutional: She is oriented to person, place, and time. She appears well-developed and well-nourished. No distress.  HENT:  Head: Normocephalic and atraumatic.  Mouth/Throat: No oropharyngeal exudate.  Eyes: EOM are normal. No scleral icterus.  Neck: Normal range of motion. Neck supple.  Cardiovascular: Normal rate, regular rhythm and normal heart sounds.  Pulmonary/Chest: Effort normal and breath sounds normal. No respiratory distress.  Abdominal: Soft. Bowel sounds are normal. She exhibits no distension. There is no abdominal tenderness. There is no rebound and no guarding.  Musculoskeletal: Normal range of motion.        General: No edema.  Neurological: She is alert and oriented to person, place, and time.  Skin: Skin is warm. No erythema.  Psychiatric: She has a normal mood and affect. Judgment and thought content normal.  Vitals reviewed.  GI:  Lab Results: Recent Labs    02/14/19 1926 02/15/19 0407  WBC 6.5 7.8  HGB 6.4* 7.2*  HCT 24.6* 26.0*  PLT 972* 762*   BMET Recent Labs    02/14/19 1926 02/15/19 0407  NA 138 137  K 3.7 4.0  CL 105 107  CO2 26 22  GLUCOSE 94 84  BUN 14 12  CREATININE 0.99 0.86  CALCIUM 9.2 8.6*   LFT Recent Labs    02/15/19 0407  PROT 6.3*  ALBUMIN 3.3*  AST 13*  ALT 13  ALKPHOS 28*  BILITOT 0.8    PT/INR Recent Labs    02/15/19 0407  LABPROT 14.0  INR 1.1     Studies/Results: No results found.  Impression/Plan: -Symptomatic anemia could be from chronic blood loss.  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 -Epigastric abdominal pain.  Intermittent for last 2 years. -History of multiple tubular adenoma as well as adenomatous mucosa in the duodenum.  Concerning for genetic syndrome. -Family history of colon cancer in mother who died in her 80s.  Discussion She underwent EGD and colonoscopy in May 2020.  Looks like, colonoscopy was aborted because of poor prep.  She was also found to have multiple polyps and there were concern for genetic syndrome such  as FAP.  I do not have full procedure report available to review but based on care everywhere review, it looks like she had multiple tubular adenomas in the colon and duodenal biopsies also showed adenomas.  She was referred for genetic counseling as well as EUS for duodenal surveillance and for biopsies.  Recommendations --------------------------- -Recommend CT enterography with IV and oral contrast for further evaluation and to rule out any small bowel lesions given recent finding of adenomatous mucosa in the duodenum.  -She was advised to follow-up with her primary GI at [facility] for repeat colonoscopy as well as for EUS as mentioned above.  -Not having any active bleeding at this time.  -Further plan based on CT scan finding.  GI will follow     LOS: 0 days   Otis Brace  MD, FACP 02/15/2019, 10:31 AM  Contact #  [phone]

## 2019-02-15 NOTE — Plan of Care (Signed)
  Problem: Education: Goal: Knowledge of General Education information will improve Description: Including pain rating scale, medication(s)/side effects and non-pharmacologic comfort measures Outcome: Progressing   Problem: Health Behavior/Discharge Planning: Goal: Ability to manage health-related needs will improve Outcome: Progressing   Problem: Clinical Measurements: Goal: Diagnostic test results will improve Outcome: Progressing   Problem: Activity: Goal: Risk for activity intolerance will decrease Outcome: Progressing   Problem: Nutrition: Goal: Adequate nutrition will be maintained Outcome: Progressing   Problem: Pain Managment: Goal: General experience of comfort will improve Outcome: Progressing   

## 2019-02-15 NOTE — H&P (Signed)
TRH H&P    Briana Powers Demographics:    Briana Powers, is a 28 y.o. female  MRN: [id number]  DOB - 05/24/1990  Admit Date - 02/14/2019  Referring MD/NP/PA:  Madilyn Hook  Outpatient Primary MD for the Briana Powers is Briana Powers, No Pcp Per  Briana Powers coming from:  Home-> [place]  Chief complaint-   Anemia, epigastric pain    HPI:    Briana Powers  is a 28 y.o. female, w hx of PUD,  aparently presents with c/o epigastric pain intermittent for the past 4 months. Intermittent brbpr.  Most recently has had epigastric pain for the past 3 days. Pt denies NSAID use.  Pt denies fever, chills, n/v, diarrhea, black stool.  Pt denies prior need for transfusion.  Pt has had prior EGD/ colonoscopy at [facility]  Colonoscopy had poor prep.    Pt went to [facility] ED for evaluation of epigastric pain.   In ED,   Wbc 6.5, Hgb 6.4, Plt 972  Na 138, K 3.7, Bun 14, Creatinine 0.99 Ast 17, Alt 15 Lipase 41  Urinalysis wbc 0-5 rbc 0-5  Pt will be admitted for anemia, epigastric pain r/o PUD   Review of systems:    In addition to the HPI above,  No Fever-chills, No Headache, No changes with Vision or hearing, No problems swallowing food or Liquids, No Chest pain, Cough or Shortness of Breath, No Nausea or Vomiting, bowel movements are regular, No Blood in  Urine, No dysuria, No new skin rashes or bruises, No new joints pains-aches,  No new weakness, tingling, numbness in any extremity, No recent weight gain or loss, No polyuria, polydypsia or polyphagia, No significant Mental Stressors.  All other systems reviewed and are negative.    Past History of the following :    History reviewed. No pertinent past medical history.    Past Surgical History:  Procedure Laterality Date  . APPENDECTOMY    . HERNIA REPAIR    . TUBAL LIGATION        Social History:      Social History    Tobacco Use  . Smoking status: Never Smoker  . Smokeless tobacco: Never Used  Substance Use Topics  . Alcohol use: Not Currently       Family History :     Family History  Problem Relation Age of Onset  . Colon cancer Mother        Home Medications:   Prior to Admission medications   Not on File     Allergies:    No Known Allergies   Physical Exam:   Vitals  Blood pressure 112/62, pulse 78, temperature 98.7 F (37.1 C), temperature source Oral, resp. rate 14, height 5\' 5"  (1.651 m), weight 50.8 kg, last menstrual period 01/09/2019, SpO2 100 %, unknown if currently breastfeeding.  1.  General: axoxo3  2. Psychiatric: euthymic  3. Neurologic: cn2-12 intact, reflexes 2+ symmetric, diffuse with no clonus, motor 5/5 in all 4 ext  4. HEENMT:  Anicteric, pupils 1.39mm  symmetric, direct, consensual, near intact Neck: no jvd  5. Respiratory : CTAB  6. Cardiovascular : rrr s1, s2, no m/g/r  7. Gastrointestinal:  Abd: soft, nt, nd, +bs  8. Skin:  Ext: no c/c/e, no rash  9.Musculoskeletal:  Good ROM    Data Review:    CBC Recent Labs  Lab 02/14/19 1926  WBC 6.5  HGB 6.4*  HCT 24.6*  PLT 972*  MCV 64.1*  MCH 16.7*  MCHC 26.0*  RDW 22.9*  LYMPHSABS 1.9  MONOABS 0.5  EOSABS 0.2  BASOSABS 0.0   ------------------------------------------------------------------------------------------------------------------  Results for orders placed or performed during the hospital encounter of 02/14/19 (from the past 48 hour(s))  Pregnancy, urine     Status: None   Collection Time: 02/14/19  7:26 PM  Result Value Ref Range   Preg Test, Ur NEGATIVE NEGATIVE    Comment:        THE SENSITIVITY OF THIS METHODOLOGY IS >20 mIU/mL. Performed at [facility], [address] [place], [place] [address]   Urinalysis, Routine w reflex microscopic     Status: Abnormal   Collection Time: 02/14/19  7:26 PM  Result Value Ref Range   Color, Urine YELLOW  YELLOW   APPearance CLOUDY (A) CLEAR   Specific Gravity, Urine 1.010 1.005 - 1.030   pH 7.5 5.0 - 8.0   Glucose, UA NEGATIVE NEGATIVE mg/dL   Hgb urine dipstick NEGATIVE NEGATIVE   Bilirubin Urine NEGATIVE NEGATIVE   Ketones, ur NEGATIVE NEGATIVE mg/dL   Protein, ur NEGATIVE NEGATIVE mg/dL   Nitrite NEGATIVE NEGATIVE   Leukocytes,Ua SMALL (A) NEGATIVE    Comment: Performed at [facility], [address] [place], [place] [address]  CBC with Differential     Status: Abnormal   Collection Time: 02/14/19  7:26 PM  Result Value Ref Range   WBC 6.5 4.0 - 10.5 K/uL   RBC 3.84 (L) 3.87 - 5.11 MIL/uL   Hemoglobin 6.4 (LL) 12.0 - 15.0 g/dL    Comment: Reticulocyte Hemoglobin testing may be clinically indicated, consider ordering this additional test [id number] THIS CRITICAL RESULT HAS VERIFIED AND BEEN CALLED TO LISA ADKINS RN BY MAGGIE OLSON ON 11 12 2020 AT 2009, AND HAS BEEN READ BACK. SLIDE SENT FOR REVIEW    HCT 24.6 (L) 36.0 - 46.0 %   MCV 64.1 (L) 80.0 - 100.0 fL   MCH 16.7 (L) 26.0 - 34.0 pg   MCHC 26.0 (L) 30.0 - 36.0 g/dL   RDW 22.9 (H) 11.5 - 15.5 %   Platelets 972 (HH) 150 - 400 K/uL    Comment: This critical result has verified and been called to Sheffield Slider RN by Bing Plume on 11 12 2020 at 2009, and has been read back. SLIDE SENT FOR REVIEW   nRBC 0.0 0.0 - 0.2 %   Neutrophils Relative % 58 %   Neutro Abs 3.8 1.7 - 7.7 K/uL   Lymphocytes Relative 30 %   Lymphs Abs 1.9 0.7 - 4.0 K/uL   Monocytes Relative 8 %   Monocytes Absolute 0.5 0.1 - 1.0 K/uL   Eosinophils Relative 3 %   Eosinophils Absolute 0.2 0.0 - 0.5 K/uL   Basophils Relative 1 %   Basophils Absolute 0.0 0.0 - 0.1 K/uL   Smear Review PLATELETS APPEAR INCREASED     Comment: PLATELET COUNT CONFIRMED BY SMEAR   Immature Granulocytes 0 %   Abs Immature Granulocytes 0.01 0.00 - 0.07 K/uL  Target Cells PRESENT    Spherocytes PRESENT    Ovalocytes PRESENT    Stomatocytes PRESENT     Comment:  Performed at [facility], [address] [place], [place] [address]  Comprehensive metabolic panel     Status: Abnormal   Collection Time: 02/14/19  7:26 PM  Result Value Ref Range   Sodium 138 135 - 145 mmol/L   Potassium 3.7 3.5 - 5.1 mmol/L   Chloride 105 98 - 111 mmol/L   CO2 26 22 - 32 mmol/L   Glucose, Bld 94 70 - 99 mg/dL   BUN 14 6 - 20 mg/dL   Creatinine, Ser 0.99 0.44 - 1.00 mg/dL   Calcium 9.2 8.9 - 10.3 mg/dL   Total Protein 7.6 6.5 - 8.1 g/dL   Albumin 3.8 3.5 - 5.0 g/dL   AST 17 15 - 41 U/L   ALT 15 0 - 44 U/L   Alkaline Phosphatase 35 (L) 38 - 126 U/L   Total Bilirubin 0.7 0.3 - 1.2 mg/dL   GFR calc non Af Amer >60 >60 mL/min   GFR calc Af Amer >60 >60 mL/min   Anion gap 7 5 - 15    Comment: Performed at [facility], [address] [place], [place] [address]  Lipase, blood     Status: None   Collection Time: 02/14/19  7:26 PM  Result Value Ref Range   Lipase 41 11 - 51 U/L    Comment: Performed at [facility], [address] [place], [place] [address]  Urinalysis, Microscopic (reflex)     Status: Abnormal   Collection Time: 02/14/19  7:26 PM  Result Value Ref Range   RBC / HPF 0-5 0 - 5 RBC/hpf   WBC, UA 0-5 0 - 5 WBC/hpf   Bacteria, UA FEW (A) NONE SEEN   Squamous Epithelial / LPF 11-20 0 - 5   Mucus PRESENT    Amorphous Crystal PRESENT     Comment: Performed at [facility], [address] [place], [place] [address]  Wet prep, genital     Status: Abnormal   Collection Time: 02/14/19  8:11 PM   Specimen: Cervix  Result Value Ref Range   Yeast Wet Prep HPF POC NONE SEEN NONE SEEN   Trich, Wet Prep NONE SEEN NONE SEEN   Clue Cells Wet Prep HPF POC PRESENT (A) NONE SEEN   WBC, Wet Prep HPF POC MANY (A) NONE SEEN   Sperm NONE SEEN     Comment: Performed at [facility], [address] [place], [place] [address]  Occult blood card to lab, stool     Status: Abnormal   Collection Time: 02/14/19   8:16 PM  Result Value Ref Range   Fecal Occult Bld POSITIVE (A) NEGATIVE    Comment: Performed at [facility], [address] [place], [place] [address]    Chemistries  Recent Labs  Lab 02/14/19 1926  NA 138  K 3.7  CL 105  CO2 26  GLUCOSE 94  BUN 14  CREATININE 0.99  CALCIUM 9.2  AST 17  ALT 15  ALKPHOS 35*  BILITOT 0.7   ------------------------------------------------------------------------------------------------------------------  ------------------------------------------------------------------------------------------------------------------ GFR: Estimated Creatinine Clearance: 69.1 mL/min (by C-G formula based on SCr of 0.99 mg/dL). Liver Function Tests: Recent Labs  Lab 02/14/19 1926  AST 17  ALT 15  ALKPHOS 35*  BILITOT 0.7  PROT 7.6  ALBUMIN 3.8   Recent Labs  Lab 02/14/19 1926  LIPASE 41   No results for input(s): AMMONIA in the last 168 hours. Coagulation Profile: No results for input(s): INR, PROTIME in the last 168 hours. Cardiac Enzymes: No results for input(s): CKTOTAL, CKMB, CKMBINDEX, TROPONINI in the last 168 hours. BNP (last 3 results) No results for input(s): PROBNP in the last 8760 hours. HbA1C: No results for input(s): HGBA1C in the last 72 hours. CBG: No results for input(s): GLUCAP in the last 168 hours. Lipid Profile: No results for input(s): CHOL, HDL, LDLCALC, TRIG, CHOLHDL, LDLDIRECT in the last 72 hours. Thyroid Function Tests: No results for input(s): TSH, T4TOTAL, FREET4, T3FREE, THYROIDAB in the last 72 hours. Anemia Panel: No results for input(s): VITAMINB12, FOLATE, FERRITIN, TIBC, IRON, RETICCTPCT in the last 72 hours.  --------------------------------------------------------------------------------------------------------------- Urine analysis:    Component Value Date/Time   COLORURINE YELLOW 02/14/2019 1926   APPEARANCEUR CLOUDY (A) 02/14/2019 1926   LABSPEC 1.010 02/14/2019 1926   PHURINE 7.5  02/14/2019 1926   GLUCOSEU NEGATIVE 02/14/2019 1926   HGBUR NEGATIVE 02/14/2019 1926   BILIRUBINUR NEGATIVE 02/14/2019 1926   KETONESUR NEGATIVE 02/14/2019 1926   PROTEINUR NEGATIVE 02/14/2019 1926   NITRITE NEGATIVE 02/14/2019 1926   LEUKOCYTESUR SMALL (A) 02/14/2019 1926      Imaging Results:    No results found.     Assessment & Plan:    Active Problems:   Symptomatic anemia  Epigastric pain , h/o PUD NPO Tramadol 50mg  po q6h prn  Pain Zofran 4mg  iv q6h prn n/v protonix 80mg  iv x1, then 8mg / hr Per ED, GI consulted , appreciate input  Anemia Type and screen, INR Check ferritin, iron, tibc, b12, folate Transfuse 2 units prbc Check cbc in am    DVT Prophylaxis-   Lovenox - SCDs   AM Labs Ordered, also please review Full Orders  Family Communication: Admission, patients condition and plan of care including tests being ordered have been discussed with the Briana Powers  who indicate understanding and agree with the plan and Code Status.  Code Status:  FULL CODE per pt,   Admission status: Observation: Based on patients clinical presentation and evaluation of above clinical data, I have made determination that Briana Powers meets observation criteria at this time.   Time spent in minutes : 55 minutes   Jani Gravel M.D on 02/15/2019 at 12:13 AM

## 2019-02-16 ENCOUNTER — Observation Stay (HOSPITAL_COMMUNITY): Payer: 59

## 2019-02-16 DIAGNOSIS — K56691 Other complete intestinal obstruction: Secondary | ICD-10-CM | POA: Diagnosis present

## 2019-02-16 DIAGNOSIS — N83201 Unspecified ovarian cyst, right side: Secondary | ICD-10-CM | POA: Diagnosis present

## 2019-02-16 DIAGNOSIS — R933 Abnormal findings on diagnostic imaging of other parts of digestive tract: Secondary | ICD-10-CM | POA: Diagnosis not present

## 2019-02-16 DIAGNOSIS — Z8711 Personal history of peptic ulcer disease: Secondary | ICD-10-CM | POA: Diagnosis not present

## 2019-02-16 DIAGNOSIS — N949 Unspecified condition associated with female genital organs and menstrual cycle: Secondary | ICD-10-CM | POA: Diagnosis not present

## 2019-02-16 DIAGNOSIS — D125 Benign neoplasm of sigmoid colon: Secondary | ICD-10-CM | POA: Diagnosis present

## 2019-02-16 DIAGNOSIS — Z800 Family history of malignant neoplasm of digestive organs: Secondary | ICD-10-CM | POA: Diagnosis not present

## 2019-02-16 DIAGNOSIS — Z8379 Family history of other diseases of the digestive system: Secondary | ICD-10-CM | POA: Diagnosis not present

## 2019-02-16 DIAGNOSIS — D649 Anemia, unspecified: Secondary | ICD-10-CM | POA: Diagnosis present

## 2019-02-16 DIAGNOSIS — D500 Iron deficiency anemia secondary to blood loss (chronic): Secondary | ICD-10-CM | POA: Diagnosis present

## 2019-02-16 DIAGNOSIS — Z20828 Contact with and (suspected) exposure to other viral communicable diseases: Secondary | ICD-10-CM | POA: Diagnosis present

## 2019-02-16 DIAGNOSIS — R635 Abnormal weight gain: Secondary | ICD-10-CM | POA: Diagnosis not present

## 2019-02-16 DIAGNOSIS — K317 Polyp of stomach and duodenum: Secondary | ICD-10-CM | POA: Diagnosis present

## 2019-02-16 DIAGNOSIS — R1013 Epigastric pain: Secondary | ICD-10-CM | POA: Diagnosis present

## 2019-02-16 DIAGNOSIS — Z86010 Personal history of colonic polyps: Secondary | ICD-10-CM | POA: Diagnosis not present

## 2019-02-16 DIAGNOSIS — R970 Elevated carcinoembryonic antigen [CEA]: Secondary | ICD-10-CM | POA: Diagnosis present

## 2019-02-16 DIAGNOSIS — K621 Rectal polyp: Secondary | ICD-10-CM | POA: Diagnosis present

## 2019-02-16 DIAGNOSIS — D509 Iron deficiency anemia, unspecified: Secondary | ICD-10-CM | POA: Diagnosis not present

## 2019-02-16 LAB — CBC
HCT: 34 % — ABNORMAL LOW (ref 36.0–46.0)
Hemoglobin: 9.3 g/dL — ABNORMAL LOW (ref 12.0–15.0)
MCH: 19.3 pg — ABNORMAL LOW (ref 26.0–34.0)
MCHC: 27.4 g/dL — ABNORMAL LOW (ref 30.0–36.0)
MCV: 70.7 fL — ABNORMAL LOW (ref 80.0–100.0)
Platelets: 635 10*3/uL — ABNORMAL HIGH (ref 150–400)
RBC: 4.81 MIL/uL (ref 3.87–5.11)
RDW: 26.1 % — ABNORMAL HIGH (ref 11.5–15.5)
WBC: 7.7 10*3/uL (ref 4.0–10.5)
nRBC: 0 % (ref 0.0–0.2)

## 2019-02-16 LAB — BASIC METABOLIC PANEL
Anion gap: 9 (ref 5–15)
BUN: 8 mg/dL (ref 6–20)
CO2: 22 mmol/L (ref 22–32)
Calcium: 8.7 mg/dL — ABNORMAL LOW (ref 8.9–10.3)
Chloride: 108 mmol/L (ref 98–111)
Creatinine, Ser: 0.74 mg/dL (ref 0.44–1.00)
GFR calc Af Amer: >60 mL/min (ref >60)
GFR calc non Af Amer: >60 mL/min (ref >60)
Glucose, Bld: 105 mg/dL — ABNORMAL HIGH (ref 70–99)
Potassium: 3.6 mmol/L (ref 3.5–5.1)
Sodium: 139 mmol/L (ref 135–145)

## 2019-02-16 LAB — HEMOGLOBIN AND HEMATOCRIT, BLOOD
HCT: 31 % — ABNORMAL LOW (ref 36.0–46.0)
HCT: 33.9 % — ABNORMAL LOW (ref 36.0–46.0)
Hemoglobin: 8.9 g/dL — ABNORMAL LOW (ref 12.0–15.0)
Hemoglobin: 9.5 g/dL — ABNORMAL LOW (ref 12.0–15.0)

## 2019-02-16 MED ORDER — POLYETHYLENE GLYCOL 3350 17 GM/SCOOP PO POWD
1.0000 | Freq: Once | ORAL | Status: AC
  Administered 2019-02-16: 255 g via ORAL
  Filled 2019-02-16: qty 255

## 2019-02-16 MED ORDER — PEG 3350-KCL-NA BICARB-NACL 420 G PO SOLR
4000.0000 mL | Freq: Once | ORAL | Status: AC
  Administered 2019-02-17: 4000 mL via ORAL
  Filled 2019-02-16 (×2): qty 4000

## 2019-02-16 MED ORDER — SODIUM CHLORIDE 0.9 % IV SOLN
INTRAVENOUS | Status: DC
  Administered 2019-02-16 – 2019-02-19 (×2): via INTRAVENOUS

## 2019-02-16 NOTE — Progress Notes (Signed)
[facility] Progress Note  Briana Powers 28 y.o. Dec 10, 1990   Subjective: Complaining of left-sided abdominal pain. Denies rectal bleeding or diarrhea.   Objective: Vital signs: Vitals:   02/15/19 2257 02/16/19 0719  BP: 105/73 101/63  Pulse: 80 75  Resp: 19 13  Temp: 98.1 F (36.7 C) 98.1 F (36.7 C)  SpO2: 100% 100%    Physical Exam: Gen: alert, no acute distress, thin  HEENT: anicteric sclera CV: RRR Chest: CTA B Abd: LLQ tenderness with guarding, otherwise nontender, soft, nondistended, +BS Ext: no edema  Lab Results: Recent Labs    02/14/19 1926 02/15/19 0407  NA 138 137  K 3.7 4.0  CL 105 107  CO2 26 22  GLUCOSE 94 84  BUN 14 12  CREATININE 0.99 0.86  CALCIUM 9.2 8.6*   Recent Labs    02/14/19 1926 02/15/19 0407  AST 17 13*  ALT 15 13  ALKPHOS 35* 28*  BILITOT 0.7 0.8  PROT 7.6 6.3*  ALBUMIN 3.8 3.3*   Recent Labs    02/14/19 1926 02/15/19 0407  02/16/19 0057 02/16/19 1121  WBC 6.5 7.8  --   --  7.7  NEUTROABS 3.8  --   --   --   --   HGB 6.4* 7.2*   < > 8.9* 9.3*  HCT 24.6* 26.0*   < > 31.0* 34.0*  MCV 64.1* 67.9*  --   --  70.7*  PLT 972* 762*  --   --  635*   < > = values in this interval not displayed.      Assessment/Plan: Symptomatic anemia with CT enterography showing colonic wall thickening at the junction of the descending colon and sigmoid colon. Poor prep on colonoscopy earlier this year that reportedly showed multiple polyps. EUS (surveillance of a duodenal adenoma), repeat colonoscopy and genetic testing planned by [facility]. Due to severe anemia on admit and LLQ pain with distal colonic wall thickening, she needs an inpt colonoscopy to further evaluate for malignancy. Two day prep starting this afternoon and colonoscopy on Monday 02/18/19. Clear liquid diet starting with dinner today. She is agreeable to this plan.   [name] 02/16/2019, 11:48 AM  Questions please call [phone] ID: [name],  female   DOB: [birth year], 28 y.o.   MRN: [id number]

## 2019-02-16 NOTE — Progress Notes (Signed)
PROGRESS NOTE                                                                                                                                                                                                             Briana Powers Demographics:    Briana Powers, is a 28 y.o. female, DOB - 1991/01/15, [id number]  Admit date - 02/14/2019   Admitting Physician Jani Gravel, MD  Outpatient Primary MD for the Briana Powers is Briana Powers, No Pcp Per  LOS - 0   Chief Complaint  Briana Powers presents with   Abdominal Pain       Brief Narrative    28 year old female to ED secondary to complaints of abdominal pain, weakness, she was found to have hemoglobin of 6.4, Briana Powers received 2 units PRBC with good response, Briana Powers with recent hospitalization at [facility] for microcytic anemia with Hemoccult positive stool, EGD and colonoscopy were held secondary to poor prep, but she was noted to have multiple polyps and there was a concern of genetic syndrome such as FAP, she did not follow-up with them as she lost her insurance, now Briana Powers has new insurance,    Subjective:    [name] today reports her pain has significantly subsided, her appetite has improved  Assessment  & Plan :    Active Problems:   Symptomatic anemia   Symptomatic anemia/chronic blood loss anemia -Briana Powers presents with epigastric abdominal pain, reports endoscopy last May significant for peptic ulcer disease, continue with Protonix drip, monitor H&H closely and transfuse as needed. -Hemoglobin 6.4 on presentation, received 2 units PRBC, good response, hemoglobin of 9, continue to monitor every 6 hours -Colonoscopy at [facility] was poor prep, but was significant for multiple polyps, there is a concern of genetic syndrome such as FAP, GI input greatly appreciated, plan for CT enterography with IV contrast for further evaluation and to rule out any small bowel lesions given  recent finding of adenomatous mucosa in the duodenum. -GI input greatly appreciated, CT enterography showing colonic wall thickening at the descending colon/sigmoid colon junction, she will need colonoscopy during inpatient, will need 2 days bowel prep(given her previous colonoscopy was with poor prep earlier this year).    COVID-19 Labs  Recent Labs    02/15/19 0009  FERRITIN 1*    Lab Results  Component Value Date  [place] NEGATIVE 02/14/2019   Right ovarian cyst -Confirmed by US pelvic transvaginal, she has no abdominal tenderness in the right abdomen to indicate any torsion, discussed with the Briana Powers, she was instructed to follow with her GYN as an outpatient in 6 weeks for repeat imaging to ensure resolution of the cyst.  Code Status : Full  Family Communication  : Cussed with Briana Powers  Disposition Plan  : Home  Barriers For Discharge : Briana Powers with history of recurrent GI bleed in the past, will need close monitoring for her hemoglobin, will need inpatient colonoscopy with 2 days bowel prep per GI (as her most recent colonoscopy with poor prep ).  Consults  : GI  Procedures  : 2 units PRBC transfusion  DVT Prophylaxis  : SCD  Lab Results  Component Value Date   PLT 635 (H) 02/16/2019    Antibiotics  :    Anti-infectives (From admission, onward)   None        Objective:   Vitals:   02/15/19 2257 02/16/19 0719 02/16/19 1347 02/16/19 1359  BP: 105/73 101/63 99/67   Pulse: 80 75 78   Resp: 19 13 12 14   Temp: 98.1 F (36.7 C) 98.1 F (36.7 C) 98.4 F (36.9 C)   TempSrc: Oral Oral Oral   SpO2: 100% 100% 100%   Weight:      Height:        Wt Readings from Last 3 Encounters:  02/15/19 52.5 kg  02/04/19 50.8 kg  03/23/18 50.8 kg     Intake/Output Summary (Last 24 hours) at 02/16/2019 1418 Last data filed at 02/16/2019 1000 Gross per 24 hour  Intake 551.18 ml  Output --  Net 551.18 ml     Physical Exam  Awake Alert, Oriented X 3, No  new F.N deficits, Normal affect Symmetrical Chest wall movement, Good air movement bilaterally, CTAB RRR,No Gallops,Rubs or new Murmurs, No Parasternal Heave +ve B.Sounds, Abd Soft, LLQ tenderness, tenderness in the right side of the abdomen no rebound - guarding or rigidity. No Cyanosis, Clubbing or edema, No new Rash or bruise     Briana Powers was seen and examined with female chaperone Lab tech Jocelyn Lamer    Data Review:    CBC Recent Labs  Lab 02/14/19 1926 02/15/19 0407 02/15/19 1009 02/15/19 1755 02/16/19 0057 02/16/19 1121  WBC 6.5 7.8  --   --   --  7.7  HGB 6.4* 7.2* 9.0* 9.5* 8.9* 9.3*  HCT 24.6* 26.0* 32.1* 33.5* 31.0* 34.0*  PLT 972* 762*  --   --   --  635*  MCV 64.1* 67.9*  --   --   --  70.7*  MCH 16.7* 18.8*  --   --   --  19.3*  MCHC 26.0* 27.7*  --   --   --  27.4*  RDW 22.9* 25.1*  --   --   --  26.1*  LYMPHSABS 1.9  --   --   --   --   --   MONOABS 0.5  --   --   --   --   --   EOSABS 0.2  --   --   --   --   --   BASOSABS 0.0  --   --   --   --   --     Chemistries  Recent Labs  Lab 02/14/19 1926 02/15/19 0407 02/16/19 1121  NA 138 137 139  K 3.7 4.0 3.6  CL 105  107 108  CO2 26 22 22   GLUCOSE 94 84 105*  BUN 14 12 8   CREATININE 0.99 0.86 0.74  CALCIUM 9.2 8.6* 8.7*  AST 17 13*  --   ALT 15 13  --   ALKPHOS 35* 28*  --   BILITOT 0.7 0.8  --    ------------------------------------------------------------------------------------------------------------------ No results for input(s): CHOL, HDL, LDLCALC, TRIG, CHOLHDL, LDLDIRECT in the last 72 hours.  No results found for: HGBA1C ------------------------------------------------------------------------------------------------------------------ No results for input(s): TSH, T4TOTAL, T3FREE, THYROIDAB in the last 72 hours.  Invalid input(s): FREET3 ------------------------------------------------------------------------------------------------------------------ Recent Labs    02/15/19 0009   VITAMINB12 285  FERRITIN 1*  TIBC 475*  IRON 8*    Coagulation profile Recent Labs  Lab 02/15/19 0407  INR 1.1    No results for input(s): DDIMER in the last 72 hours.  Cardiac Enzymes No results for input(s): CKMB, TROPONINI, MYOGLOBIN in the last 168 hours.  Invalid input(s): CK ------------------------------------------------------------------------------------------------------------------ No results found for: BNP  Inpatient Medications  Scheduled Meds:  polyethylene glycol powder  1 Container Oral Once   [START ON 02/17/2019] polyethylene glycol-electrolytes  4,000 mL Oral Once   Continuous Infusions:  sodium chloride 10 mL/hr at 02/16/19 1354   pantoprozole (PROTONIX) infusion 8 mg/hr (02/16/19 0917)   PRN Meds:.traMADol  Micro Results Recent Results (from the past 240 hour(s))  Wet prep, genital     Status: Abnormal   Collection Time: 02/14/19  8:11 PM   Specimen: Cervix  Result Value Ref Range Status   Yeast Wet Prep HPF POC NONE SEEN NONE SEEN Final   Trich, Wet Prep NONE SEEN NONE SEEN Final   Clue Cells Wet Prep HPF POC PRESENT (A) NONE SEEN Final   WBC, Wet Prep HPF POC MANY (A) NONE SEEN Final   Sperm NONE SEEN  Final    Comment: Performed at [facility], [address] [place], [place] [address]  SARS CORONAVIRUS 2 (TAT 6-24 HRS) Nasopharyngeal Nasopharyngeal Swab     Status: None   Collection Time: 02/14/19  9:15 PM   Specimen: Nasopharyngeal Swab  Result Value Ref Range Status   SARS Coronavirus 2 NEGATIVE NEGATIVE Final    Comment: (NOTE) SARS-CoV-2 target nucleic acids are NOT DETECTED. The SARS-CoV-2 RNA is generally detectable in upper and lower respiratory specimens during the acute phase of infection. Negative results do not preclude SARS-CoV-2 infection, do not rule out co-infections with other pathogens, and should not be used as the sole basis for treatment or other Briana Powers management decisions. Negative results  must be combined with clinical observations, Briana Powers history, and epidemiological information. The expected result is Negative. Fact Sheet for Patients: SugarRoll.be Fact Sheet for Healthcare Providers: https://www.woods-mathews.com/ This test is not yet approved or cleared by the [place] FDA and  has been authorized for detection and/or diagnosis of SARS-CoV-2 by FDA under an Emergency Use Authorization (EUA). This EUA will remain  in effect (meaning this test can be used) for the duration of the COVID-19 declaration under Section 56 4(b)(1) of the Act, 21 U.S.C. section 360bbb-3(b)(1), unless the authorization is terminated or revoked sooner. Performed at [facility], [place] [address] [place], [place] [address]   MRSA PCR Screening     Status: None   Collection Time: 02/14/19 11:27 PM   Specimen: Nasal Mucosa; Nasopharyngeal  Result Value Ref Range Status   MRSA by PCR NEGATIVE NEGATIVE Final    Comment:        The  GeneXpert MRSA Assay (FDA approved for NASAL specimens only), is one component of a comprehensive MRSA colonization surveillance program. It is not intended to diagnose MRSA infection nor to guide or monitor treatment for MRSA infections. Performed at [facility], [place] [address] [place],  [address]     Radiology Reports Ct Diannia Ruder [id number]  Result Date: 02/15/2019 CLINICAL DATA:  Inpatient. Epigastric abdominal pain and symptomatic anemia, with episodes of bright red blood per rectum for several months. History of duodenal adenoma. EXAM: CT ABDOMEN AND PELVIS WITH CONTRAST (ENTEROGRAPHY) TECHNIQUE: Multidetector CT of the abdomen and pelvis during bolus administration of intravenous contrast. Negative oral contrast was given. CONTRAST:  129mL OMNIPAQUE IOHEXOL 300 MG/ML  SOLN COMPARISON:  None. FINDINGS: Lower chest: No significant pulmonary nodules or acute consolidative  airspace disease. Hepatobiliary: Normal liver size. No liver mass. Normal gallbladder with no radiopaque cholelithiasis. No biliary ductal dilatation. Pancreas: Normal, with no mass or duct dilation. Spleen: Normal size. No mass. Adrenals/Urinary Tract: Normal adrenals. Normal kidneys with no hydronephrosis and no renal mass. Normal bladder. Stomach/Bowel: Normal non-distended stomach. Normal caliber small bowel. No small bowel wall thickening or mucosal hyperenhancement. No discrete small bowel mass. No pneumatosis. No appreciable bowel fistula. Appendectomy. Mobile cecum located in the right upper quadrant. There is segmental circumferential wall thickening at the junction of the descending and sigmoid colon in the left lower quadrant (series 4/image 141 and series 7/image 29). No additional sites of large bowel wall thickening. No significant colonic diverticulosis. Mild-to-moderate colonic stool. Vascular/Lymphatic: Normal caliber abdominal aorta. Borderline prominent 1.0 cm left inguinal node (series 4/image 197). No additional pathologically enlarged lymph nodes in the abdomen or pelvis. Reproductive: Grossly normal anteverted uterus. No left adnexal mass. There is a 4.3 x 4.3 cm right adnexal mass with mildly irregular peripheral wall thickening and enhancement and central low attenuation (series 4/image 162). Other: Small amount of simple free fluid in the pelvic cul-de-sac. No focal fluid collection. No pneumoperitoneum. Musculoskeletal: No aggressive appearing focal osseous lesions. IMPRESSION: 1. Segmental circumferential wall thickening at the junction of the descending and sigmoid colon, indeterminate, differential includes inflammatory or neoplastic etiologies. GI consultation for colonoscopy correlation suggested. 2. No bowel obstruction.  No abscess.  No fistula.  No free air. 3. Indeterminate 4.3 cm right adnexal lesion with irregular peripheral wall thickening and central low attenuation. Small  volume free fluid in the pelvic cul-de-sac. Findings may represent corpus luteal cyst, however ultrasound correlation advised to exclude other etiologies. Electronically Signed   By: Ilona Sorrel M.D.   On: 02/15/2019 15:24   US Pelvic Complete With Transvaginal  Result Date: 02/16/2019 CLINICAL DATA:  Evaluate right adnexal mass EXAM: TRANSABDOMINAL AND TRANSVAGINAL ULTRASOUND OF PELVIS DOPPLER ULTRASOUND OF OVARIES TECHNIQUE: Both transabdominal and transvaginal ultrasound examinations of the pelvis were performed. Transabdominal technique was performed for global imaging of the pelvis including uterus, ovaries, adnexal regions, and pelvic cul-de-sac. It was necessary to proceed with endovaginal exam following the transabdominal exam to visualize the ovaries and adnexa. Color and duplex Doppler ultrasound was utilized to evaluate blood flow to the ovaries. COMPARISON:  CT enterography abdomen and pelvis, 02/15/2019 FINDINGS: Uterus Measurements: 8.7 x 4.8 x 5.5 cm = volume: 119 mL. No fibroids or other mass visualized. Endometrium Thickness: 8 mm.  No focal abnormality visualized. Right ovary Measurements: 5.8 x 5.2 x 4.9 cm = volume: 79 mL. The right ovary is enlarged by a dominant cyst measuring 3.9 x 3.5 x 3.5 cm with  multiple smaller cysts and follicles. Left ovary Measurements: 4.0 x 2.3 x 2.1 cm = volume: 10 mL. Multiple small follicles. Pulsed Doppler evaluation of both ovaries demonstrates normal low-resistance arterial and venous waveforms. Other findings Trace free fluid in the pelvis. IMPRESSION: 1. The right ovary is enlarged by a dominant cyst measuring 3.9 cm, in keeping with prior CT findings and almost certainly a benign functional cyst/follicular remnant in the reproductive age setting. Consider follow-up ultrasound at 6 weeks to assess for stability or resolution. 2. Arterial and venous Doppler flow are present to the ovaries bilaterally, however intermittent or incomplete ovarian torsion  cannot be strictly excluded in any enlarged ovary if there is acutely referable pain. 3.  Trace, nonspecific free fluid in the pelvis. Electronically Signed   By: Eddie Candle M.D.   On: 02/16/2019 09:45      Phillips Climes M.D on 02/16/2019 at 2:18 PM  Between 7am to 7pm - Pager - [phone]  After 7pm go to [email or web address] - password [facility]  [facility] -  Office  [phone]

## 2019-02-17 LAB — BPAM RBC
Blood Product Expiration Date: 202012102359
Blood Product Expiration Date: 202012102359
ISSUE DATE / TIME: 202011130120
ISSUE DATE / TIME: 202011130437
Unit Type and Rh: 6200
Unit Type and Rh: 6200

## 2019-02-17 LAB — TYPE AND SCREEN
ABO/RH(D): A POS
Antibody Screen: NEGATIVE
Unit division: 0
Unit division: 0

## 2019-02-17 LAB — BASIC METABOLIC PANEL
Anion gap: 8 (ref 5–15)
BUN: 10 mg/dL (ref 6–20)
CO2: 22 mmol/L (ref 22–32)
Calcium: 8.4 mg/dL — ABNORMAL LOW (ref 8.9–10.3)
Chloride: 107 mmol/L (ref 98–111)
Creatinine, Ser: 0.75 mg/dL (ref 0.44–1.00)
GFR calc Af Amer: >60 mL/min (ref >60)
GFR calc non Af Amer: >60 mL/min (ref >60)
Glucose, Bld: 95 mg/dL (ref 70–99)
Potassium: 3.8 mmol/L (ref 3.5–5.1)
Sodium: 137 mmol/L (ref 135–145)

## 2019-02-17 LAB — CBC
HCT: 30.5 % — ABNORMAL LOW (ref 36.0–46.0)
Hemoglobin: 8.6 g/dL — ABNORMAL LOW (ref 12.0–15.0)
MCH: 19.9 pg — ABNORMAL LOW (ref 26.0–34.0)
MCHC: 28.2 g/dL — ABNORMAL LOW (ref 30.0–36.0)
MCV: 70.6 fL — ABNORMAL LOW (ref 80.0–100.0)
Platelets: 549 10*3/uL — ABNORMAL HIGH (ref 150–400)
RBC: 4.32 MIL/uL (ref 3.87–5.11)
RDW: 26.3 % — ABNORMAL HIGH (ref 11.5–15.5)
WBC: 7.2 10*3/uL (ref 4.0–10.5)
nRBC: 0 % (ref 0.0–0.2)

## 2019-02-17 LAB — FOLATE RBC
Folate, Hemolysate: 304 ng/mL
Folate, RBC: 1156 ng/mL (ref >498)
Hematocrit: 26.3 % — ABNORMAL LOW (ref 34.0–46.6)

## 2019-02-17 NOTE — Progress Notes (Signed)
PROGRESS NOTE                                                                                                                                                                                                             Patient Demographics:    Briana Powers, is a 28 y.o. female, DOB - 03/20/1991, [id number]  Admit date - 02/14/2019   Admitting Physician Jani Gravel, MD  Outpatient Primary MD for the patient is Patient, No Pcp Per  LOS - 1   Chief Complaint  Patient presents with   Abdominal Pain       Brief Narrative    28 year old female to ED secondary to complaints of abdominal pain, weakness, she was found to have hemoglobin of 6.4, patient received 2 units PRBC with good response, patient with recent hospitalization at [facility] for microcytic anemia with Hemoccult positive stool, EGD and colonoscopy were held secondary to poor prep, but she was noted to have multiple polyps and there was a concern of genetic syndrome such as FAP, she did not follow-up with them as she lost her insurance, now patient has new insurance,    Subjective:    Briana Powers today reports good night sleep, denies any complaints, reports pain has improved, reports good BM, clear in color.  Assessment  & Plan :    Active Problems:   Symptomatic anemia   Symptomatic anemia/chronic blood loss anemia -Patient presents with epigastric abdominal pain, reports endoscopy last May significant for peptic ulcer disease, continue with Protonix drip, monitor H&H closely and transfuse as needed. -Hemoglobin 6.4 on presentation, received 2 units PRBC, good response, repeat hemoglobin this morning 8.6, continue to monitor. -Colonoscopy at [facility] was poor prep, but was significant for multiple polyps, there is a concern of genetic syndrome such as FAP, GI input greatly appreciated, plan for CT enterography with IV contrast for further evaluation and to  rule out any small bowel lesions given recent finding of adenomatous mucosa in the duodenum. -GI input greatly appreciated, CT enterography showing colonic wall thickening at the descending colon/sigmoid colon junction, plan for colonoscopy tomorrow, she will have 2 days bowel prep (given her previous colonoscopy was with poor prep earlier this year).    COVID-19 Labs  Recent Labs    02/15/19 0009  FERRITIN 1*  Lab Results  Component Value Date   [place] NEGATIVE 02/14/2019   Right ovarian cyst -Confirmed by US pelvic transvaginal, she has no abdominal tenderness in the right abdomen to indicate any torsion, discussed with the patient, she was instructed to follow with her GYN as an outpatient in 6 weeks for repeat imaging to ensure resolution of the cyst.  Code Status : Full  Family Communication  : Cussed with patient  Disposition Plan  : Home  Consults  : GI  Procedures  : 2 units PRBC transfusion  DVT Prophylaxis  : SCD  Lab Results  Component Value Date   PLT 549 (H) 02/17/2019    Antibiotics  :    Anti-infectives (From admission, onward)   None        Objective:   Vitals:   02/16/19 1347 02/16/19 1359 02/16/19 2008 02/17/19 0553  BP: 99/67  105/67 107/66  Pulse: 78  70 72  Resp: 12 14  16   Temp: 98.4 F (36.9 C)  98 F (36.7 C) 98.6 F (37 C)  TempSrc: Oral  Oral Oral  SpO2: 100%  99% 100%  Weight:      Height:        Wt Readings from Last 3 Encounters:  02/15/19 52.5 kg  02/04/19 50.8 kg  03/23/18 50.8 kg     Intake/Output Summary (Last 24 hours) at 02/17/2019 1136 Last data filed at 02/16/2019 1500 Gross per 24 hour  Intake 126.69 ml  Output --  Net 126.69 ml     Physical Exam  Awake Alert, Oriented X 3, No new F.N deficits, Normal affect Symmetrical Chest wall movement, Good air movement bilaterally, CTAB RRR,No Gallops,Rubs or new Murmurs, No Parasternal Heave +ve B.Sounds, Abd Soft, mild LLQ  tenderness, No rebound -  guarding or rigidity. No Cyanosis, Clubbing or edema, No new Rash or bruise     Patient was seen and examined with female chaperone RN kim.    Data Review:    CBC Recent Labs  Lab 02/14/19 1926  02/15/19 0407  02/15/19 1755 02/16/19 0057 02/16/19 1121 02/16/19 1828 02/17/19 0517  WBC 6.5  --  7.8  --   --   --  7.7  --  7.2  HGB 6.4*  --  7.2*   < > 9.5* 8.9* 9.3* 9.5* 8.6*  HCT 24.6*   < > 26.0*   < > 33.5* 31.0* 34.0* 33.9* 30.5*  PLT 972*  --  762*  --   --   --  635*  --  549*  MCV 64.1*  --  67.9*  --   --   --  70.7*  --  70.6*  MCH 16.7*  --  18.8*  --   --   --  19.3*  --  19.9*  MCHC 26.0*  --  27.7*  --   --   --  27.4*  --  28.2*  RDW 22.9*  --  25.1*  --   --   --  26.1*  --  26.3*  LYMPHSABS 1.9  --   --   --   --   --   --   --   --   MONOABS 0.5  --   --   --   --   --   --   --   --   EOSABS 0.2  --   --   --   --   --   --   --   --  BASOSABS 0.0  --   --   --   --   --   --   --   --    < > = values in this interval not displayed.    Chemistries  Recent Labs  Lab 02/14/19 1926 02/15/19 0407 02/16/19 1121 02/17/19 0517  NA 138 137 139 137  K 3.7 4.0 3.6 3.8  CL 105 107 108 107  CO2 26 22 22 22   GLUCOSE 94 84 105* 95  BUN 14 12 8 10   CREATININE 0.99 0.86 0.74 0.75  CALCIUM 9.2 8.6* 8.7* 8.4*  AST 17 13*  --   --   ALT 15 13  --   --   ALKPHOS 35* 28*  --   --   BILITOT 0.7 0.8  --   --    ------------------------------------------------------------------------------------------------------------------ No results for input(s): CHOL, HDL, LDLCALC, TRIG, CHOLHDL, LDLDIRECT in the last 72 hours.  No results found for: HGBA1C ------------------------------------------------------------------------------------------------------------------ No results for input(s): TSH, T4TOTAL, T3FREE, THYROIDAB in the last 72 hours.  Invalid input(s):  FREET3 ------------------------------------------------------------------------------------------------------------------ Recent Labs    02/15/19 0009  VITAMINB12 285  FERRITIN 1*  TIBC 475*  IRON 8*    Coagulation profile Recent Labs  Lab 02/15/19 0407  INR 1.1    No results for input(s): DDIMER in the last 72 hours.  Cardiac Enzymes No results for input(s): CKMB, TROPONINI, MYOGLOBIN in the last 168 hours.  Invalid input(s): CK ------------------------------------------------------------------------------------------------------------------ No results found for: BNP  Inpatient Medications  Scheduled Meds:  polyethylene glycol-electrolytes  4,000 mL Oral Once   Continuous Infusions:  sodium chloride 10 mL/hr at 02/16/19 1354   pantoprozole (PROTONIX) infusion 8 mg/hr (02/17/19 0408)   PRN Meds:.traMADol  Micro Results Recent Results (from the past 240 hour(s))  Wet prep, genital     Status: Abnormal   Collection Time: 02/14/19  8:11 PM   Specimen: Cervix  Result Value Ref Range Status   Yeast Wet Prep HPF POC NONE SEEN NONE SEEN Final   Trich, Wet Prep NONE SEEN NONE SEEN Final   Clue Cells Wet Prep HPF POC PRESENT (A) NONE SEEN Final   WBC, Wet Prep HPF POC MANY (A) NONE SEEN Final   Sperm NONE SEEN  Final    Comment: Performed at [facility], [address] [place], [place] [address]  SARS CORONAVIRUS 2 (TAT 6-24 HRS) Nasopharyngeal Nasopharyngeal Swab     Status: None   Collection Time: 02/14/19  9:15 PM   Specimen: Nasopharyngeal Swab  Result Value Ref Range Status   SARS Coronavirus 2 NEGATIVE NEGATIVE Final    Comment: (NOTE) SARS-CoV-2 target nucleic acids are NOT DETECTED. The SARS-CoV-2 RNA is generally detectable in upper and lower respiratory specimens during the acute phase of infection. Negative results do not preclude SARS-CoV-2 infection, do not rule out co-infections with other pathogens, and should not be used as the sole  basis for treatment or other patient management decisions. Negative results must be combined with clinical observations, patient history, and epidemiological information. The expected result is Negative. Fact Sheet for Patients: SugarRoll.be Fact Sheet for Healthcare Providers: https://www.woods-mathews.com/ This test is not yet approved or cleared by the [place] FDA and  has been authorized for detection and/or diagnosis of SARS-CoV-2 by FDA under an Emergency Use Authorization (EUA). This EUA will remain  in effect (meaning this test can be used) for the duration of the COVID-19 declaration under Section 56 4(b)(1) of the Act, 21  U.S.C. section 360bbb-3(b)(1), unless the authorization is terminated or revoked sooner. Performed at [facility], [place] [address], [place], [place] [address]   MRSA PCR Screening     Status: None   Collection Time: 02/14/19 11:27 PM   Specimen: Nasal Mucosa; Nasopharyngeal  Result Value Ref Range Status   MRSA by PCR NEGATIVE NEGATIVE Final    Comment:        The GeneXpert MRSA Assay (FDA approved for NASAL specimens only), is one component of a comprehensive MRSA colonization surveillance program. It is not intended to diagnose MRSA infection nor to guide or monitor treatment for MRSA infections. Performed at [facility], [place] [address] [place], [place] [address]     Radiology Reports Ct Diannia Ruder [id number]  Result Date: 02/15/2019 CLINICAL DATA:  Inpatient. Epigastric abdominal pain and symptomatic anemia, with episodes of bright red blood per rectum for several months. History of duodenal adenoma. EXAM: CT ABDOMEN AND PELVIS WITH CONTRAST (ENTEROGRAPHY) TECHNIQUE: Multidetector CT of the abdomen and pelvis during bolus administration of intravenous contrast. Negative oral contrast was given. CONTRAST:  183mL OMNIPAQUE IOHEXOL 300 MG/ML  SOLN COMPARISON:  None.  FINDINGS: Lower chest: No significant pulmonary nodules or acute consolidative airspace disease. Hepatobiliary: Normal liver size. No liver mass. Normal gallbladder with no radiopaque cholelithiasis. No biliary ductal dilatation. Pancreas: Normal, with no mass or duct dilation. Spleen: Normal size. No mass. Adrenals/Urinary Tract: Normal adrenals. Normal kidneys with no hydronephrosis and no renal mass. Normal bladder. Stomach/Bowel: Normal non-distended stomach. Normal caliber small bowel. No small bowel wall thickening or mucosal hyperenhancement. No discrete small bowel mass. No pneumatosis. No appreciable bowel fistula. Appendectomy. Mobile cecum located in the right upper quadrant. There is segmental circumferential wall thickening at the junction of the descending and sigmoid colon in the left lower quadrant (series 4/image 141 and series 7/image 29). No additional sites of large bowel wall thickening. No significant colonic diverticulosis. Mild-to-moderate colonic stool. Vascular/Lymphatic: Normal caliber abdominal aorta. Borderline prominent 1.0 cm left inguinal node (series 4/image 197). No additional pathologically enlarged lymph nodes in the abdomen or pelvis. Reproductive: Grossly normal anteverted uterus. No left adnexal mass. There is a 4.3 x 4.3 cm right adnexal mass with mildly irregular peripheral wall thickening and enhancement and central low attenuation (series 4/image 162). Other: Small amount of simple free fluid in the pelvic cul-de-sac. No focal fluid collection. No pneumoperitoneum. Musculoskeletal: No aggressive appearing focal osseous lesions. IMPRESSION: 1. Segmental circumferential wall thickening at the junction of the descending and sigmoid colon, indeterminate, differential includes inflammatory or neoplastic etiologies. GI consultation for colonoscopy correlation suggested. 2. No bowel obstruction.  No abscess.  No fistula.  No free air. 3. Indeterminate 4.3 cm right adnexal lesion  with irregular peripheral wall thickening and central low attenuation. Small volume free fluid in the pelvic cul-de-sac. Findings may represent corpus luteal cyst, however ultrasound correlation advised to exclude other etiologies. Electronically Signed   By: Ilona Sorrel M.D.   On: 02/15/2019 15:24   US Pelvic Complete With Transvaginal  Result Date: 02/16/2019 CLINICAL DATA:  Evaluate right adnexal mass EXAM: TRANSABDOMINAL AND TRANSVAGINAL ULTRASOUND OF PELVIS DOPPLER ULTRASOUND OF OVARIES TECHNIQUE: Both transabdominal and transvaginal ultrasound examinations of the pelvis were performed. Transabdominal technique was performed for global imaging of the pelvis including uterus, ovaries, adnexal regions, and pelvic cul-de-sac. It was necessary to proceed with endovaginal exam following the transabdominal exam to visualize the ovaries and adnexa. Color and duplex Doppler ultrasound was  utilized to evaluate blood flow to the ovaries. COMPARISON:  CT enterography abdomen and pelvis, 02/15/2019 FINDINGS: Uterus Measurements: 8.7 x 4.8 x 5.5 cm = volume: 119 mL. No fibroids or other mass visualized. Endometrium Thickness: 8 mm.  No focal abnormality visualized. Right ovary Measurements: 5.8 x 5.2 x 4.9 cm = volume: 79 mL. The right ovary is enlarged by a dominant cyst measuring 3.9 x 3.5 x 3.5 cm with multiple smaller cysts and follicles. Left ovary Measurements: 4.0 x 2.3 x 2.1 cm = volume: 10 mL. Multiple small follicles. Pulsed Doppler evaluation of both ovaries demonstrates normal low-resistance arterial and venous waveforms. Other findings Trace free fluid in the pelvis. IMPRESSION: 1. The right ovary is enlarged by a dominant cyst measuring 3.9 cm, in keeping with prior CT findings and almost certainly a benign functional cyst/follicular remnant in the reproductive age setting. Consider follow-up ultrasound at 6 weeks to assess for stability or resolution. 2. Arterial and venous Doppler flow are present to  the ovaries bilaterally, however intermittent or incomplete ovarian torsion cannot be strictly excluded in any enlarged ovary if there is acutely referable pain. 3.  Trace, nonspecific free fluid in the pelvis. Electronically Signed   By: Eddie Candle M.D.   On: 02/16/2019 09:45      Phillips Climes M.D on 02/17/2019 at 11:36 AM  Between 7am to 7pm - Pager - [phone]  After 7pm go to [email or web address] - password [facility]  [facility] -  Office  [phone]

## 2019-02-17 NOTE — H&P (View-Only) (Signed)
[facility] Progress Note  Briana Powers 28 y.o. 1990-10-09   Subjective: Less abdominal pain. Nonbloody stools this morning after drinking Miralax prep. Denies rectal bleeding.  Objective: Vital signs: Vitals:   02/17/19 0553 02/17/19 1326  BP: 107/66 99/61  Pulse: 72 64  Resp: 16 16  Temp: 98.6 F (37 C) 98.7 F (37.1 C)  SpO2: 100% 100%    Physical Exam: Gen: alert, no acute distress, thin, pleasant HEENT: anicteric sclera CV: RRR Chest: CTA B Abd: LLQ tenderness with guarding, soft, nondistended, +BS Ext: no edema  Lab Results: Recent Labs    02/16/19 1121 02/17/19 0517  NA 139 137  K 3.6 3.8  CL 108 107  CO2 22 22  GLUCOSE 105* 95  BUN 8 10  CREATININE 0.74 0.75  CALCIUM 8.7* 8.4*   Recent Labs    02/14/19 1926 02/15/19 0407  AST 17 13*  ALT 15 13  ALKPHOS 35* 28*  BILITOT 0.7 0.8  PROT 7.6 6.3*  ALBUMIN 3.8 3.3*   Recent Labs    02/14/19 1926  02/16/19 1121 02/16/19 1828 02/17/19 0517  WBC 6.5   < > 7.7  --  7.2  NEUTROABS 3.8  --   --   --   --   HGB 6.4*   < > 9.3* 9.5* 8.6*  HCT 24.6*   < > 34.0* 33.9* 30.5*  MCV 64.1*   < > 70.7*  --  70.6*  PLT 972*   < > 635*  --  549*   < > = values in this interval not displayed.      Assessment/Plan: Symptomatic anemia with left colonic wall thickening on CT enterography undergoing a 2 day prep for colonoscopy that is scheduled for tomorrow at noon by Dr. Therisa Powers. Reported concern for FAP or a genetic polyposis syndrome on poorly prepped colonoscopy at [facility]. See yesterday's note for details. NPO p MN. Clear liquid diet. Colonoscopy tomorrow.   Briana Powers 02/17/2019, 4:20 PM  Questions please call [phone] ID: Briana Powers, female   DOB: 02-17-1991, 28 y.o.   MRN: [id number]

## 2019-02-17 NOTE — Progress Notes (Signed)
[facility] Progress Note  Briana Powers 28 y.o. 30-Aug-1990   Subjective: Less abdominal pain. Nonbloody stools this morning after drinking Miralax prep. Denies rectal bleeding.  Objective: Vital signs: Vitals:   02/17/19 0553 02/17/19 1326  BP: 107/66 99/61  Pulse: 72 64  Resp: 16 16  Temp: 98.6 F (37 C) 98.7 F (37.1 C)  SpO2: 100% 100%    Physical Exam: Gen: alert, no acute distress, thin, pleasant HEENT: anicteric sclera CV: RRR Chest: CTA B Abd: LLQ tenderness with guarding, soft, nondistended, +BS Ext: no edema  Lab Results: Recent Labs    02/16/19 1121 02/17/19 0517  NA 139 137  K 3.6 3.8  CL 108 107  CO2 22 22  GLUCOSE 105* 95  BUN 8 10  CREATININE 0.74 0.75  CALCIUM 8.7* 8.4*   Recent Labs    02/14/19 1926 02/15/19 0407  AST 17 13*  ALT 15 13  ALKPHOS 35* 28*  BILITOT 0.7 0.8  PROT 7.6 6.3*  ALBUMIN 3.8 3.3*   Recent Labs    02/14/19 1926  02/16/19 1121 02/16/19 1828 02/17/19 0517  WBC 6.5   < > 7.7  --  7.2  NEUTROABS 3.8  --   --   --   --   HGB 6.4*   < > 9.3* 9.5* 8.6*  HCT 24.6*   < > 34.0* 33.9* 30.5*  MCV 64.1*   < > 70.7*  --  70.6*  PLT 972*   < > 635*  --  549*   < > = values in this interval not displayed.      Assessment/Plan: Symptomatic anemia with left colonic wall thickening on CT enterography undergoing a 2 day prep for colonoscopy that is scheduled for tomorrow at noon by Dr. Therisa Doyne. Reported concern for FAP or a genetic polyposis syndrome on poorly prepped colonoscopy at [facility]. See yesterday's note for details. NPO p MN. Clear liquid diet. Colonoscopy tomorrow.   Briana Powers 02/17/2019, 4:20 PM  Questions please call [phone] ID: Briana Powers, female   DOB: 02/15/1991, 28 y.o.   MRN: [id number]

## 2019-02-18 ENCOUNTER — Inpatient Hospital Stay (HOSPITAL_COMMUNITY): Payer: 59 | Admitting: Certified Registered"

## 2019-02-18 ENCOUNTER — Encounter (HOSPITAL_COMMUNITY)
Admission: EM | Disposition: A | Discharge status: Home / Self Care | Payer: Self-pay | Source: Home / Self Care | Attending: Internal Medicine

## 2019-02-18 ENCOUNTER — Encounter (HOSPITAL_COMMUNITY): Payer: Self-pay

## 2019-02-18 DIAGNOSIS — Z86010 Personal history of colonic polyps: Secondary | ICD-10-CM

## 2019-02-18 DIAGNOSIS — Z800 Family history of malignant neoplasm of digestive organs: Secondary | ICD-10-CM

## 2019-02-18 DIAGNOSIS — R933 Abnormal findings on diagnostic imaging of other parts of digestive tract: Secondary | ICD-10-CM

## 2019-02-18 DIAGNOSIS — D509 Iron deficiency anemia, unspecified: Secondary | ICD-10-CM

## 2019-02-18 DIAGNOSIS — N83201 Unspecified ovarian cyst, right side: Secondary | ICD-10-CM

## 2019-02-18 HISTORY — PX: BIOPSY: SHX5522

## 2019-02-18 HISTORY — PX: FLEXIBLE SIGMOIDOSCOPY: SHX5431

## 2019-02-18 LAB — GC/CHLAMYDIA PROBE AMP (~~LOC~~) NOT AT ARMC
Chlamydia: NEGATIVE
Neisseria Gonorrhea: NEGATIVE

## 2019-02-18 LAB — PATHOLOGIST SMEAR REVIEW

## 2019-02-18 SURGERY — BIOPSY
Anesthesia: Monitor Anesthesia Care

## 2019-02-18 MED ORDER — LACTATED RINGERS IV SOLN
INTRAVENOUS | Status: DC
  Administered 2019-02-18: 1000 mL via INTRAVENOUS

## 2019-02-18 MED ORDER — PROPOFOL 500 MG/50ML IV EMUL
INTRAVENOUS | Status: DC | PRN
  Administered 2019-02-18: 150 ug/kg/min via INTRAVENOUS

## 2019-02-18 MED ORDER — PROPOFOL 500 MG/50ML IV EMUL
INTRAVENOUS | Status: AC
  Filled 2019-02-18: qty 50

## 2019-02-18 MED ORDER — PROPOFOL 10 MG/ML IV BOLUS
INTRAVENOUS | Status: DC | PRN
  Administered 2019-02-18 (×2): 20 mg via INTRAVENOUS

## 2019-02-18 MED ORDER — SODIUM CHLORIDE 0.9 % IV SOLN
25.0000 mg | Freq: Once | INTRAVENOUS | Status: AC
  Administered 2019-02-19: 25 mg via INTRAVENOUS
  Filled 2019-02-18: qty 0.5

## 2019-02-18 MED ORDER — SODIUM CHLORIDE 0.9 % IV SOLN
1000.0000 mg | Freq: Once | INTRAVENOUS | Status: AC
  Administered 2019-02-19: 1000 mg via INTRAVENOUS
  Filled 2019-02-18: qty 20

## 2019-02-18 MED ORDER — SODIUM CHLORIDE 0.9 % IV SOLN: 1000.0000 mg | Freq: Once | INTRAVENOUS | Status: DC

## 2019-02-18 MED ORDER — LIDOCAINE 2% (20 MG/ML) 5 ML SYRINGE
INTRAMUSCULAR | Status: DC | PRN
  Administered 2019-02-18: 40 mg via INTRAVENOUS

## 2019-02-18 SURGICAL SUPPLY — 22 items

## 2019-02-18 NOTE — Transfer of Care (Signed)
Immediate Anesthesia Transfer of Care Note  Patient: Briana Powers  Procedure(s) Performed: BIOPSY FLEXIBLE SIGMOIDOSCOPY (N/A )  Patient Location: PACU  Anesthesia Type:MAC  Level of Consciousness: sedated  Airway & Oxygen Therapy: Patient Spontanous Breathing and Patient connected to face mask oxygen  Post-op Assessment: Report given to RN and Post -op Vital signs reviewed and stable  Post vital signs: Reviewed and stable  Last Vitals:  Vitals Value Taken Time  BP    Temp    Pulse    Resp    SpO2      Last Pain:  Vitals:   02/18/19 1112  TempSrc: Oral  PainSc: 0-No pain         Complications: No apparent anesthesia complications

## 2019-02-18 NOTE — Anesthesia Procedure Notes (Signed)
Date/Time: 02/18/2019 12:06 PM Performed by: Talbot Grumbling, CRNA Oxygen Delivery Method: Simple face mask

## 2019-02-18 NOTE — Progress Notes (Signed)
PROGRESS NOTE                                                                                                                                                                                                             Patient Demographics:    Briana Powers, is a 28 y.o. female, DOB - 09-Oct-1990, [id number]  Admit date - 02/14/2019   Admitting Physician Jani Gravel, MD  Outpatient Primary MD for the patient is Patient, No Pcp Per  LOS - 2   Chief Complaint  Patient presents with   Abdominal Pain       Brief Narrative    28 year old female to ED secondary to complaints of abdominal pain, weakness, she was found to have hemoglobin of 6.4, patient received 2 units PRBC with good response, patient with recent hospitalization at [facility] for microcytic anemia with Hemoccult positive stool, EGD and colonoscopy were held secondary to poor prep, but she was noted to have multiple polyps and there was a concern of genetic syndrome such as FAP, she did not follow-up with them as she lost her insurance, patient went for colonoscopy 11/16, significant for sigmoid mass, multiple polyps, unable to pass the scope, findings highly suspicious of FAP.   Subjective:    Briana Powers this morning reports she had a good night sleep, had clear-colored bowel movements, with the prep.   Assessment  & Plan :    Active Problems:   Symptomatic anemia   Symptomatic anemia/chronic blood loss anemia  -Patient presents with epigastric abdominal pain, reports endoscopy last May significant for peptic ulcer disease, continue with Protonix drip, monitor H&H closely and transfuse as needed. -Hemoglobin 6.4 on presentation, received 2 units PRBC, good response, repeat hemoglobin remained stable. -Colonoscopy at [facility] regional was poor prep, but was significant for multiple polyps, there is a concern of genetic syndrome such as FAP, GI input greatly  appreciated, plan for CT enterography with IV contrast for further evaluation and to rule out any small bowel lesions given recent finding of adenomatous mucosa in the duodenum. -GI input greatly appreciated, CT enterography showing colonic wall thickening at the descending colon/sigmoid colon junction. 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 -General surgery consulted. -Oncology service consulted    COVID-19 Labs  No results for input(s): DDIMER, FERRITIN, LDH, CRP in the last 72 hours.  Lab Results  Component Value Date   [place] NEGATIVE 02/14/2019   Right ovarian cyst -Confirmed by US pelvic transvaginal, she has no abdominal tenderness in the right abdomen to indicate any torsion, discussed with the patient, she was instructed to follow with her GYN as an outpatient in 6 weeks for repeat imaging to ensure resolution of the cyst.  Code Status : Full  Family Communication  : discussed with patient  Disposition Plan  : Home  Consults  : GI, general surgery, oncology  Procedures  : 2 units PRBC transfusion, colonoscopy  DVT Prophylaxis  : SCD, she was encouraged to ambulate given she can be started on chemical prophylaxis due to GI bleed  Lab Results  Component Value Date   PLT 549 (H) 02/17/2019    Antibiotics  :    Anti-infectives (From admission, onward)   None        Objective:   Vitals:   02/18/19 0523 02/18/19 0524 02/18/19 1112 02/18/19 1235  BP: 95/67 (!) 93/59 101/62 (!) 102/59  Pulse: (!) 57  62 73  Resp: 16  14 (!) 23  Temp: 98 F (36.7 C)  99.1 F (37.3 C) (!) 97.5 F (36.4 C)  TempSrc: Oral  Oral Oral  SpO2: 100%  97% 100%  Weight: 51.3 kg       Height:        Wt Readings from Last 3 Encounters:  02/18/19 51.3 kg  02/04/19 50.8 kg  03/23/18 50.8 kg     Intake/Output Summary (Last 24 hours) at 02/18/2019 1524 Last data filed at 02/18/2019 1240 Gross per 24 hour  Intake 694.55 ml  Output 300 ml  Net 394.55 ml     Physical Exam  She was seen and examined earlier this morning before colonoscopy  Awake Alert, Oriented X 3, No new F.N deficits, Normal affect Symmetrical Chest wall movement, Good air movement bilaterally, CTAB RRR,No Gallops,Rubs or new Murmurs, No Parasternal Heave +ve B.Sounds, Abd Soft, left lower quadrant tenderness, No rebound - guarding or rigidity. No Cyanosis, Clubbing or edema, No new Rash or bruise     Patient was seen and examined with female chaperone RN Orvil Feil.  She was seen and examined before colonoscopy    Data Review:    CBC Recent Labs  Lab 02/14/19 1926  02/15/19 0407  02/15/19 1755 02/16/19 0057 02/16/19 1121 02/16/19 1828 02/17/19 0517  WBC 6.5  --  7.8  --   --   --  7.7  --  7.2  HGB 6.4*  --  7.2*   < > 9.5* 8.9* 9.3* 9.5* 8.6*  HCT 24.6*   < > 26.0*   < > 33.5* 31.0* 34.0* 33.9* 30.5*  PLT 972*  --  762*  --   --   --  635*  --  549*  MCV 64.1*  --  67.9*  --   --   --  70.7*  --  70.6*  MCH 16.7*  --  18.8*  --   --   --  19.3*  --  19.9*  MCHC 26.0*  --  27.7*  --   --   --  27.4*  --  28.2*  RDW 22.9*  --  25.1*  --   --   --  26.1*  --  26.3*  LYMPHSABS 1.9  --   --   --   --   --   --   --   --   MONOABS 0.5  --   --   --   --   --   --   --   --   EOSABS 0.2  --   --   --   --   --   --   --   --   BASOSABS 0.0  --   --   --   --   --   --   --   --    < > = values in this interval not displayed.    Chemistries  Recent Labs  Lab 02/14/19 1926 02/15/19 0407 02/16/19 1121 02/17/19 0517  NA 138 137 139 137  K 3.7 4.0 3.6 3.8  CL 105 107 108 107  CO2 26 22 22 22   GLUCOSE 94 84 105* 95  BUN 14 12 8 10   CREATININE 0.99 0.86 0.74 0.75  CALCIUM 9.2  8.6* 8.7* 8.4*  AST 17 13*  --   --   ALT 15 13  --   --   ALKPHOS 35* 28*  --   --   BILITOT 0.7 0.8  --   --    ------------------------------------------------------------------------------------------------------------------ No results for input(s): CHOL, HDL, LDLCALC, TRIG, CHOLHDL, LDLDIRECT in the last 72 hours.  No results found for: HGBA1C ------------------------------------------------------------------------------------------------------------------ No results for input(s): TSH, T4TOTAL, T3FREE, THYROIDAB in the last 72 hours.  Invalid input(s): FREET3 ------------------------------------------------------------------------------------------------------------------ No results for input(s): VITAMINB12, FOLATE, FERRITIN, TIBC, IRON, RETICCTPCT in the last 72 hours.  Coagulation profile Recent Labs  Lab 02/15/19 0407  INR 1.1    No results for input(s): DDIMER in the last 72 hours.  Cardiac Enzymes No results for input(s): CKMB, TROPONINI, MYOGLOBIN in the last 168 hours.  Invalid input(s): CK ------------------------------------------------------------------------------------------------------------------ No results found for: BNP  Inpatient Medications  Scheduled Meds:  Continuous Infusions:  sodium chloride Stopped (02/18/19 0411)   lactated ringers Stopped (02/18/19 1300)   PRN Meds:.traMADol  Micro Results Recent Results (from the past 240 hour(s))  Wet prep, genital     Status: Abnormal   Collection Time: 02/14/19  8:11 PM   Specimen: Cervix  Result Value Ref Range Status   Yeast Wet Prep HPF POC NONE SEEN NONE SEEN Final   Trich, Wet Prep NONE SEEN NONE SEEN Final   Clue Cells Wet Prep HPF POC PRESENT (A) NONE SEEN Final   WBC, Wet Prep HPF POC MANY (A) NONE SEEN Final   Sperm NONE SEEN  Final    Comment: Performed at [facility], [address] [place], [place] [address]  SARS CORONAVIRUS 2 (TAT 6-24 HRS) Nasopharyngeal  Nasopharyngeal Swab     Status: None   Collection Time: 02/14/19  9:15 PM   Specimen: Nasopharyngeal Swab  Result Value Ref Range Status   SARS Coronavirus 2 NEGATIVE NEGATIVE Final    Comment: (NOTE) SARS-CoV-2 target nucleic acids are NOT DETECTED. The SARS-CoV-2 RNA is generally detectable in upper and lower respiratory specimens during the acute phase of infection. Negative results do not preclude SARS-CoV-2 infection, do not rule out co-infections with other pathogens, and should not be used as the sole basis for treatment or other patient management decisions. Negative results must be combined with  clinical observations, patient history, and epidemiological information. The expected result is Negative. Fact Sheet for Patients: SugarRoll.be Fact Sheet for Healthcare Providers: https://www.woods-mathews.com/ This test is not yet approved or cleared by the [place] FDA and  has been authorized for detection and/or diagnosis of SARS-CoV-2 by FDA under an Emergency Use Authorization (EUA). This EUA will remain  in effect (meaning this test can be used) for the duration of the COVID-19 declaration under Section 56 4(b)(1) of the Act, 21 U.S.C. section 360bbb-3(b)(1), unless the authorization is terminated or revoked sooner. Performed at [facility], [place] [address], [place], [place] [address]   MRSA PCR Screening     Status: None   Collection Time: 02/14/19 11:27 PM   Specimen: Nasal Mucosa; Nasopharyngeal  Result Value Ref Range Status   MRSA by PCR NEGATIVE NEGATIVE Final    Comment:        The GeneXpert MRSA Assay (FDA approved for NASAL specimens only), is one component of a comprehensive MRSA colonization surveillance program. It is not intended to diagnose MRSA infection nor to guide or monitor treatment for MRSA infections. Performed at [facility], [place] [address], [place], [place] [address]       Radiology Reports Ct Diannia Ruder [id number]  Result Date: 02/15/2019 CLINICAL DATA:  Inpatient. Epigastric abdominal pain and symptomatic anemia, with episodes of bright red blood per rectum for several months. History of duodenal adenoma. EXAM: CT ABDOMEN AND PELVIS WITH CONTRAST (ENTEROGRAPHY) TECHNIQUE: Multidetector CT of the abdomen and pelvis during bolus administration of intravenous contrast. Negative oral contrast was given. CONTRAST:  168mL OMNIPAQUE IOHEXOL 300 MG/ML  SOLN COMPARISON:  None. FINDINGS: Lower chest: No significant pulmonary nodules or acute consolidative airspace disease. Hepatobiliary: Normal liver size. No liver mass. Normal gallbladder with no radiopaque cholelithiasis. No biliary ductal dilatation. Pancreas: Normal, with no mass or duct dilation. Spleen: Normal size. No mass. Adrenals/Urinary Tract: Normal adrenals. Normal kidneys with no hydronephrosis and no renal mass. Normal bladder. Stomach/Bowel: Normal non-distended stomach. Normal caliber small bowel. No small bowel wall thickening or mucosal hyperenhancement. No discrete small bowel mass. No pneumatosis. No appreciable bowel fistula. Appendectomy. Mobile cecum located in the right upper quadrant. There is segmental circumferential wall thickening at the junction of the descending and sigmoid colon in the left lower quadrant (series 4/image 141 and series 7/image 29). No additional sites of large bowel wall thickening. No significant colonic diverticulosis. Mild-to-moderate colonic stool. Vascular/Lymphatic: Normal caliber abdominal aorta. Borderline prominent 1.0 cm left inguinal node (series 4/image 197). No additional pathologically enlarged lymph nodes in the abdomen or pelvis. Reproductive: Grossly normal anteverted uterus. No left adnexal mass. There is a 4.3 x 4.3 cm right adnexal mass with mildly irregular peripheral wall thickening and enhancement and central low attenuation (series 4/image 162). Other:  Small amount of simple free fluid in the pelvic cul-de-sac. No focal fluid collection. No pneumoperitoneum. Musculoskeletal: No aggressive appearing focal osseous lesions. IMPRESSION: 1. Segmental circumferential wall thickening at the junction of the descending and sigmoid colon, indeterminate, differential includes inflammatory or neoplastic etiologies. GI consultation for colonoscopy correlation suggested. 2. No bowel obstruction.  No abscess.  No fistula.  No free air. 3. Indeterminate 4.3 cm right adnexal lesion with irregular peripheral wall thickening and central low attenuation. Small volume free fluid in the pelvic cul-de-sac. Findings may represent corpus luteal cyst, however ultrasound correlation advised to exclude other etiologies. Electronically Signed   By: Ilona Sorrel M.D.   On: 02/15/2019  15:24   US Pelvic Complete With Transvaginal  Result Date: 02/16/2019 CLINICAL DATA:  Evaluate right adnexal mass EXAM: TRANSABDOMINAL AND TRANSVAGINAL ULTRASOUND OF PELVIS DOPPLER ULTRASOUND OF OVARIES TECHNIQUE: Both transabdominal and transvaginal ultrasound examinations of the pelvis were performed. Transabdominal technique was performed for global imaging of the pelvis including uterus, ovaries, adnexal regions, and pelvic cul-de-sac. It was necessary to proceed with endovaginal exam following the transabdominal exam to visualize the ovaries and adnexa. Color and duplex Doppler ultrasound was utilized to evaluate blood flow to the ovaries. COMPARISON:  CT enterography abdomen and pelvis, 02/15/2019 FINDINGS: Uterus Measurements: 8.7 x 4.8 x 5.5 cm = volume: 119 mL. No fibroids or other mass visualized. Endometrium Thickness: 8 mm.  No focal abnormality visualized. Right ovary Measurements: 5.8 x 5.2 x 4.9 cm = volume: 79 mL. The right ovary is enlarged by a dominant cyst measuring 3.9 x 3.5 x 3.5 cm with multiple smaller cysts and follicles. Left ovary Measurements: 4.0 x 2.3 x 2.1 cm = volume: 10 mL.  Multiple small follicles. Pulsed Doppler evaluation of both ovaries demonstrates normal low-resistance arterial and venous waveforms. Other findings Trace free fluid in the pelvis. IMPRESSION: 1. The right ovary is enlarged by a dominant cyst measuring 3.9 cm, in keeping with prior CT findings and almost certainly a benign functional cyst/follicular remnant in the reproductive age setting. Consider follow-up ultrasound at 6 weeks to assess for stability or resolution. 2. Arterial and venous Doppler flow are present to the ovaries bilaterally, however intermittent or incomplete ovarian torsion cannot be strictly excluded in any enlarged ovary if there is acutely referable pain. 3.  Trace, nonspecific free fluid in the pelvis. Electronically Signed   By: Eddie Candle M.D.   On: 02/16/2019 09:45      Phillips Climes M.D on 02/18/2019 at 3:24 PM  Between 7am to 7pm - Pager - [phone]  After 7pm go to [email or web address] - password [email or web address]  [facility] -  Office  [phone]

## 2019-02-18 NOTE — Brief Op Note (Signed)
02/14/2019 - 02/18/2019  12:45 PM  PATIENT:  [name]  28 y.o. female  PRE-OPERATIVE DIAGNOSIS:  Anemia; Abdominal pain; Abnormal CT scan  POST-OPERATIVE DIAGNOSIS:  sigmoid mass  PROCEDURE:  Procedure(s): BIOPSY FLEXIBLE SIGMOIDOSCOPY (N/A)  SURGEON:  Surgeon(s) and Role:    Ronnette Juniper, MD - Primary  PHYSICIAN ASSISTANT:   ASSISTANTS:Lisa Helayne Seminole Proctor,Tech   ANESTHESIA:   MAC  YYQ:MGNOIBB  BLOOD ADMINISTERED:none  DRAINS: none   LOCAL MEDICATIONS USED:  NONE  SPECIMEN:  Biopsy / Limited Resection  DISPOSITION OF SPECIMEN:  PATHOLOGY  COUNTS:  YES  TOURNIQUET:  * No tourniquets in log *  DICTATION: .Dragon Dictation  PLAN OF CARE: Admit to inpatient   PATIENT DISPOSITION:  PACU - hemodynamically stable.   Delay start of Pharmacological VTE agent (>24hrs) due to surgical blood loss or risk of bleeding: yes

## 2019-02-18 NOTE — Op Note (Signed)
[facility] Patient Name: Briana Powers Procedure Date: 02/18/2019 MRN: [id number] Attending MD: Ronnette Juniper , MD Date of Birth: 1990/08/23 CSN: [id number] Age: 28 Admit Type: Inpatient Procedure:                Colonoscopy Indications:              (mother is 40s)(twin borther required partial                            colectomy), Last colonoscopy: April 2020- poor prep                            and incomplete, Adenomatous polyps in the colon,                            likley has Personal history of familial adenomatous                            polyposis Providers:                Ronnette Juniper, MD, Burtis Junes, RN, Ladona Ridgel,                            Technician, Cherylynn Ridges, Technician, [place]                            Edathil CRNA, Marla Roe, CRNA Referring MD:              Medicines:                Monitored Anesthesia Care Complications:            No immediate complications. Estimated blood loss:                            Minimal. Estimated Blood Loss:     Estimated blood loss was minimal. Procedure:                Pre-Anesthesia Assessment:                           - Prior to the procedure, a History and Physical                            was performed, and patient medications and                            allergies were reviewed. The patient's tolerance of                            previous anesthesia was also reviewed. The risks                            and benefits of the procedure and the sedation                            options and risks were discussed with the  patient.                            All questions were answered, and informed consent                            was obtained. Prior Anticoagulants: The patient has                            taken no previous anticoagulant or antiplatelet                            agents. ASA Grade Assessment: II - A patient with                            mild systemic disease. After  reviewing the risks                            and benefits, the patient was deemed in                            satisfactory condition to undergo the procedure.                           After obtaining informed consent, the colonoscope                            was passed under direct vision. Throughout the                            procedure, the patient's blood pressure, pulse, and                            oxygen saturations were monitored continuously. The                            PCF-H190DL [id number] Olympus pediatric colonscope                            was introduced through the anus and advanced to the                            the sigmoid colon. The PCF-H190DL [id number] Olympus                            pediatric colonscope was introduced through the and                            advanced to the. The colonoscopy was performed                            without difficulty. The patient tolerated the  procedure well. The quality of the bowel                            preparation was good. Scope In: 12:10:15 PM Scope Out: 12:26:03 PM Total Procedure Duration: 0 hours 15 minutes 48 seconds  Findings:      The perianal and digital rectal examinations were normal.      Innumerable sessile polyps were found in the rectum. The polyps were 5       to 12 mm in size. Polypectomy was not attempted.      A frond-like/villous, fungating, infiltrative, polypoid and sessile       completely obstructing large mass was found in the sigmoid colon. The       mass was circumferential. No bleeding was present but the mass was       friable and bled easily on biopsies. This was biopsied with a cold       forceps for histology. The pediatric colonoscope was exchanged for an       ultra slim colonoscope. However, the scope could not be passed beyond       the narrowed/strictured simgoid lumen at 25 cm. Impression:               - Innumerable 5 to 12 mm polyps in the  rectum.                            Resection not attempted.                           - Likely malignant completely obstructing tumor in                            the sigmoid colon. Biopsied. Moderate Sedation:      Patient did not receive moderate sedation for this procedure, but       instead received monitored anesthesia care. Recommendation:           Await pathology results.                           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                           Recommend surgical evaluation for consideration for                            total proctocolectomy and end ileostomy.                           Perform genetic testing for FAP and                            MUTYH-associated polyposis (MAP), oncology consult.                           Upper endoscopy using a forward-viewing endoscope                            for gastric polyps and a side-viewing duodenoscope  for duodenal polyps regularly as an outpatient.                           Annual thyroid examination with thyroid ultrasound Procedure Code(s):        --- Professional ---                           [phone], Colonoscopy, flexible; with biopsy, single                            or multiple Diagnosis Code(s):        --- Professional ---                           K62.1, Rectal polyp                           D49.0, Neoplasm of unspecified behavior of                            digestive system                           K56.691, Other complete intestinal obstruction                           D12.6, Benign neoplasm of colon, unspecified                           Z86.010, Personal history of colonic polyps CPT copyright 2019 American Medical Association. All rights reserved. The codes documented in this report are preliminary and upon coder review may  be revised to meet current compliance requirements. Ronnette Juniper, MD 02/18/2019 12:45:42 PM This report has been signed electronically. Number of Addenda: 0

## 2019-02-18 NOTE — Anesthesia Preprocedure Evaluation (Addendum)
Anesthesia Evaluation  Patient identified by MRN, date of birth, ID band Patient awake    Reviewed: Allergy & Precautions, NPO status , Patient's Chart, lab work & pertinent test results  History of Anesthesia Complications Negative for: history of anesthetic complications  Airway Mallampati: I  TM Distance: >3 FB Neck ROM: Full    Dental  (+) Teeth Intact   Pulmonary neg pulmonary ROS,    Pulmonary exam normal        Cardiovascular negative cardio ROS Normal cardiovascular exam     Neuro/Psych negative neurological ROS  negative psych ROS   GI/Hepatic Neg liver ROS, PUD,   Endo/Other  negative endocrine ROS  Renal/GU negative Renal ROS  negative genitourinary   Musculoskeletal negative musculoskeletal ROS (+)   Abdominal   Peds  Hematology  (+) anemia ,   Anesthesia Other Findings   Reproductive/Obstetrics                            Anesthesia Physical Anesthesia Plan  ASA: II  Anesthesia Plan: MAC   Post-op Pain Management:    Induction: Intravenous  PONV Risk Score and Plan: Propofol infusion, TIVA and Treatment may vary due to age or medical condition  Airway Management Planned: Natural Airway, Nasal Cannula and Simple Face Mask  Additional Equipment: None  Intra-op Plan:   Post-operative Plan:   Informed Consent: I have reviewed the patients History and Physical, chart, labs and discussed the procedure including the risks, benefits and alternatives for the proposed anesthesia with the patient or authorized representative who has indicated his/her understanding and acceptance.       Plan Discussed with:   Anesthesia Plan Comments:         Anesthesia Quick Evaluation

## 2019-02-18 NOTE — Op Note (Signed)
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   Findings: Innumerable sessile polyps found in the rectum from 5 to 12 mm in size, polypectomy not attempted(patient known to have adenoma from prior outside colonoscopy). A villous, fungating, infiltrative, polypoid and sessile obstructive mass noted at 25 cm from insertion in the sigmoid, suspicious for malignancy, biopsies taken. The pediatric colonoscope could not be advanced beyond the narrowed lumen, despite exchanging with an ultra slim colonoscope, the scope could not be advanced beyond the sigmoid mass at 25 cm.    Recommendations Await pathology results.  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  Recommend oncology consult.  Recommend surgical evaluation for consideration for total proctocolectomy and end ileostomy.   Perform genetic testing for FAP and MUTYH-associated polyposis (MAP).  Upper endoscopy using a forward-viewing endoscope for gastric polyps and a side-viewing duodenoscope for duodenal polyps regularly as an outpatient.  Annual thyroid examination with thyroid ultrasound. Clear liquid diet for now.  Ronnette Juniper, MD

## 2019-02-18 NOTE — Anesthesia Postprocedure Evaluation (Signed)
Anesthesia Post Note  Patient: Briana Powers  Procedure(s) Performed: BIOPSY FLEXIBLE SIGMOIDOSCOPY (N/A )     Patient location during evaluation: Endoscopy Anesthesia Type: MAC Level of consciousness: awake and alert Pain management: pain level controlled Vital Signs Assessment: post-procedure vital signs reviewed and stable Respiratory status: spontaneous breathing, nonlabored ventilation and respiratory function stable Cardiovascular status: blood pressure returned to baseline and stable Postop Assessment: no apparent nausea or vomiting Anesthetic complications: no    Last Vitals:  Vitals:   02/18/19 1112 02/18/19 1235  BP: 101/62 (!) 102/59  Pulse: 62 73  Resp: 14 (!) 23  Temp: 37.3 C (!) 36.4 C  SpO2: 97% 100%    Last Pain:  Vitals:   02/18/19 1240  TempSrc:   PainSc: 2                  Lidia Collum

## 2019-02-18 NOTE — Consult Note (Signed)
[facility] Surgery Consult Note  Torra Baune October 01, 1990  [id number].    Requesting MD: Dr. Noelle Penner Chief Complaint: Lower abdominal pain Reason for Consult: Sigmoid colon mass   HPI:  Patient is a 28 year old female who presented with complaints of epigastric pain on and off for 4 months.  She is also had some bright red blood per rectum on and off.   She had been seen in [facility] and had an EGD and some issue with an ulcer.  She presented with 3-day history of pain.  She denied using NSAID drugs.  She was seen at [facility].  She is found to have a WBC of 6.5, and a hemoglobin of 6.4, platelets were 972,000.  She was transferred to [facility] for further evaluation and treatment.  She was transfused with 2 units of packed cells.    She was seen by Dr. Alessandra Bevels gastroenterology service.  She notes she is currently being [facility] for microcytic anemia by the GI service there.  She has a history of fecal occult positive for blood.  She underwent an EGD and colonoscopy 08/2018.  Colonoscopy was aborted due to a poor prep.  She was found to have multiple polyps and there was concern for genetic syndrome such as FAP. She also had a ulcer per the patient and was taking a lot of Ibuprofen for dysmenorrhea and heavy bleeding.  She stopped taking NSAID products at that time.  Her brother had a portion of his colon taken out in his 53s for heavy bleeding also.   She underwent CT enterography on 02/15/2019.  This showed a segmental circumferential wall thickening at the junction of the descending and sigmoid colon is indeterminate etiology.  There was no bowel obstruction no abscess and no fistula no free air.  There is also a 4.3 cm adnexal lesion with irregular all peripheral wall thickening and central low-attenuation which could be a corpus luteal cyst.    On 02/16/19, she underwent transvaginal ultrasound.  This showed the right ovary is enlarged by a  dominant cyst measuring 3.9 cm in keeping with prior CT most certainly benign functional cyst/follicle remnant in reproductive age setting.  Arterial and venous Dopplers present to the ovaries bilaterally however there was intermittent or incomplete ovarian torsion could not be strictly excluded.  She is seen by Dr.Dorn in [facility] for GYN issues.   She underwent colonoscopy today by Dr. Ronnette Juniper.  This revealed: Innumerable 5 to 12 mm polyps in the rectum. Resection not attempted.  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 Biopsied.  She was not able to pass pediatric scope beyond the mass at 25 cm.   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  Recommend surgical evaluation for consideration for total proctocolectomy and end ileostomy. Perform genetic testing for FAP and MUTYH-associated polyposis (MAP), oncology consult. Upper endoscopy using a forward-viewing endoscope for gastric polyps and a sideviewing duodenoscope for duodenal polyps regularly as an outpatient. Annual thyroid examination with thyroid ultrasound.  ROS: Review of Systems  Constitutional: Negative.   HENT: Negative.   Eyes: Negative.   Respiratory: Negative.   Cardiovascular: Negative.   Gastrointestinal: Positive for blood in stool and heartburn. Negative for abdominal pain, constipation, diarrhea, nausea and vomiting.  Genitourinary: Negative.   Skin: Negative.   Neurological: Negative.   Endo/Heme/Allergies: Negative for environmental allergies and polydipsia. Does not bruise/bleed easily.  Psychiatric/Behavioral: Negative.     Family History  Problem  Relation Age of Onset  . Colon cancer Mother   . Colon cancer Brother     Past Medical History:  Diagnosis Date  . PUD (peptic ulcer disease)     Past Surgical History:  Procedure Laterality Date  . APPENDECTOMY    . HERNIA REPAIR    . TUBAL LIGATION      Social History:  reports that she has never smoked. She has never used  smokeless tobacco. She reports previous alcohol use. No history on file for drug.  Allergies: No Known Allergies  No medications prior to admission.    Blood pressure (!) 102/59, pulse 73, temperature (!) 97.5 F (36.4 C), temperature source Oral, resp. rate (!) 23, height 5\' 5"  (1.651 m), weight 51.3 kg, last menstrual period 01/09/2019, SpO2 100 %, unknown if currently breastfeeding. Physical Exam: Physical Exam Constitutional:      General: She is not in acute distress.    Appearance: She is well-developed and normal weight. She is not ill-appearing, toxic-appearing or diaphoretic.  HENT:     Head: Normocephalic and atraumatic.  Eyes:     General: No scleral icterus.    Comments: Pupils are equal  Cardiovascular:     Rate and Rhythm: Normal rate and regular rhythm.     Heart sounds: Normal heart sounds.  Pulmonary:     Effort: Pulmonary effort is normal.     Breath sounds: Normal breath sounds.  Abdominal:     General: Abdomen is flat. Bowel sounds are normal. There is no distension.     Palpations: Abdomen is soft.     Tenderness: There is abdominal tenderness in the right lower quadrant.     Hernia: No hernia is present.  Skin:    General: Skin is warm and dry.     Capillary Refill: Capillary refill takes less than 2 seconds.  Neurological:     General: No focal deficit present.     Mental Status: She is alert and oriented to person, place, and time.     Cranial Nerves: No cranial nerve deficit.  Psychiatric:        Mood and Affect: Mood normal. Mood is not anxious or depressed.        Behavior: Behavior normal.     Results for orders placed or performed during the hospital encounter of 02/14/19 (from the past 48 hour(s))  Hemoglobin and hematocrit, blood     Status: Abnormal   Collection Time: 02/16/19  6:28 PM  Result Value Ref Range   Hemoglobin 9.5 (L) 12.0 - 15.0 g/dL   HCT 33.9 (L) 36.0 - 46.0 %    Comment: Performed at [facility], [place] [address], [place], [place] [address]  CBC in AM     Status: Abnormal   Collection Time: 02/17/19  5:17 AM  Result Value Ref Range   WBC 7.2 4.0 - 10.5 K/uL   RBC 4.32 3.87 - 5.11 MIL/uL   Hemoglobin 8.6 (L) 12.0 - 15.0 g/dL    Comment: Reticulocyte Hemoglobin testing may be clinically indicated, consider ordering this additional test [id number]    HCT 30.5 (L) 36.0 - 46.0 %   MCV 70.6 (L) 80.0 - 100.0 fL   MCH 19.9 (L) 26.0 - 34.0 pg   MCHC 28.2 (L) 30.0 - 36.0 g/dL   RDW 26.3 (H) 11.5 - 15.5 %   Platelets 549 (H) 150 - 400 K/uL   nRBC 0.0 0.0 - 0.2 %    Comment: Performed at  [facility], [place] [address] [place], [place] [address]  BMP in AM     Status: Abnormal   Collection Time: 02/17/19  5:17 AM  Result Value Ref Range   Sodium 137 135 - 145 mmol/L   Potassium 3.8 3.5 - 5.1 mmol/L   Chloride 107 98 - 111 mmol/L   CO2 22 22 - 32 mmol/L   Glucose, Bld 95 70 - 99 mg/dL   BUN 10 6 - 20 mg/dL   Creatinine, Ser 0.75 0.44 - 1.00 mg/dL   Calcium 8.4 (L) 8.9 - 10.3 mg/dL   GFR calc non Af Amer >60 >60 mL/min   GFR calc Af Amer >60 >60 mL/min   Anion gap 8 5 - 15    Comment: Performed at [facility], [place] [address] [place], [place] [address]   No results found.    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  Await biopsies, Dr. Hassell Done will see and discuss options when the biopsies are back.  I will ask nutrition to see her and work on improving her calories and protein stores during the interim.  We will get a prealbumin with a.m. labs.      Earnstine Regal [facility] Surgery 02/18/2019, 3:22 PM Please see Amion for pager number during day hours 7:00am-4:30pm

## 2019-02-18 NOTE — Consult Note (Addendum)
[place]  [phone]) [phone] Fax:([phone]) [phone]   MEDICAL ONCOLOGY - INITIAL CONSULTATION  Referral MD: Dr. Phillips Climes  Reason for Referral: Abnormal CT scan, duodenal adenomas suspicious for FAP  HPI: Briana Powers is a 28 year old female with a past medical history significant for PUD.  She presented to the emergency room at [facility] with epigastric pain that had been intermittent for the past 4 months along with intermittent bright red blood per rectum.  In the 3 days prior to admission, she had worsening epigastric pain.  In the emergency room, her hemoglobin was 6.9, MCV 64.1, platelet count 972,000, stool for occult blood positive.  She received 2 units packed red blood cells on 02/14/2019.  She had additional lab work obtained on 02/15/2019 which showed a ferritin of 1, iron 8, TIBC 475, percent saturation 2%.  She had a CT of the abdomen pelvis with contrast on 02/15/2019 which showed segmental circumferential wall thickening at the junction of the descending and sigmoid colon, indeterminate, differential includes inflammatory or neoplastic etiologies, no bowel obstruction, abscess, or fistula, no free air, indeterminate 4.3 cm right adnexal lesion with irregular peripheral wall thickening and central low-attenuation, small volume free fluid in the pelvic cul-de-sac.  She had a pelvic ultrasound performed on 02/16/2019 to follow-up on the enlarged adnexal lesion which showed that the right ovary was enlarged by dominant cyst measuring 3.9 cm and almost certainly represents a benign functional cyst/follicular remnant in the reproductive age setting.  The patient underwent a colonoscopy earlier today which showed innumerable sessile polyps found in the rectum from 5 to 12 mm in size, of villous fungating, infiltrative, polypoid and sessile obstructive mass noted at 25 cm from insertion of the sigmoid which was suspicious for malignancy and biopsies were  taken.  The patient had a prior GI work-up at [facility] regional for her microcytic anemia and FOBT positive.  She underwent EGD and colonoscopy in May 2020 and looks like the colonoscopy was aborted due to poor prep.  She was found to have multiple polyps and there was concern for neck syndrome such as FAP.  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  She was referred for genetic counseling as well as EUS for duodenal surveillance and for targeted biopsies but states that she had not had this done yet.  When seen today, the patient reports that her abdominal pain has improved.  She denies having fevers or chills.  She denies having any anorexia or weight loss.  She has not had any headaches or dizziness.  Denies chest discomfort and shortness of breath.  She has not had any nausea or vomiting.  She has not had any melena or hematochezia since admission.  No change in her stool consistency.  The patient is single and lives in [place], [place].  She has 3 children ages 36, 46, and 3.  Denies history of alcohol and tobacco use.  States that her mother died from colon cancer.  I believe she was in her 63s.  States that her brother also required partial colectomy and she states this was also for colon cancer.  He did not require any additional treatment other than surgery.  Medical oncology was asked see the patient to make recommendations regarding her abnormal CT and colonoscopy and for possible FAP.   Past Medical History:  Diagnosis Date  . PUD (  peptic ulcer disease)   :  Past Surgical History:  Procedure Laterality Date  . APPENDECTOMY    . HERNIA REPAIR    . TUBAL LIGATION    :  Current Facility-Administered Medications  Medication Dose Route Frequency Provider Last Rate Last Dose  . 0.9 %  sodium chloride infusion   Intravenous Continuous Ronnette Juniper, MD   Stopped at 02/18/19 0411   . lactated ringers infusion   Intravenous Continuous Ronnette Juniper, MD   Stopped at 02/18/19 1300  . traMADol (ULTRAM) tablet 50 mg  50 mg Oral Q6H PRN Ronnette Juniper, MD   50 mg at 02/18/19 1426     No Known Allergies:  Family History  Problem Relation Age of Onset  . Colon cancer Mother   . Colon cancer Brother   :  Social History   Socioeconomic History  . Marital status: Single    Spouse name: Not on file  . Number of children: 3  . Years of education: Not on file  . Highest education level: Not on file  Occupational History  . Not on file  Social Needs  . Financial resource strain: Not on file  . Food insecurity    Worry: Not on file    Inability: Not on file  . Transportation needs    Medical: Not on file    Non-medical: Not on file  Tobacco Use  . Smoking status: Never Smoker  . Smokeless tobacco: Never Used  Substance and Sexual Activity  . Alcohol use: Not Currently  . Drug use: Not on file  . Sexual activity: Not on file  Lifestyle  . Physical activity    Days per week: Not on file    Minutes per session: Not on file  . Stress: Not on file  Relationships  . Social Herbalist on phone: Not on file    Gets together: Not on file    Attends religious service: Not on file    Active member of club or organization: Not on file    Attends meetings of clubs or organizations: Not on file    Relationship status: Not on file  . Intimate partner violence    Fear of current or ex partner: Not on file    Emotionally abused: Not on file    Physically abused: Not on file    Forced sexual activity: Not on file  Other Topics Concern  . Not on file  Social History Narrative  . Not on file  :  Review of Systems: A comprehensive 14 point review of systems was negative except as noted in the HPI.  Exam: Patient Vitals for the past 24 hrs:  BP Temp Temp src Pulse Resp SpO2 Weight  02/18/19 1235 (!) 102/59 (!) 97.5 F (36.4 C) Oral 73 (!) 23 100 % -   02/18/19 1112 101/62 99.1 F (37.3 C) Oral 62 14 97 % -  02/18/19 0524 (!) 93/59 - - - - - -  02/18/19 0523 95/67 98 F (36.7 C) Oral (!) 57 16 100 % 113 lb 1.5 oz (51.3 kg)  02/17/19 2214 110/71 98.2 F (36.8 C) Oral 61 20 100 % -  02/17/19 1750 107/78 (!) 97.5 F (36.4 C) Oral 64 10 98 % -    General:  well-nourished in no acute distress.   Eyes:  no scleral icterus.   ENT:  There were no oropharyngeal lesions.   Neck was without thyromegaly.   Lymphatics:  Negative cervical,  supraclavicular or axillary adenopathy.   Respiratory: lungs were clear bilaterally without wheezing or crackles.   Cardiovascular:  Regular rate and rhythm, S1/S2, without murmur, rub or gallop.  There was no pedal edema.   GI:  abdomen was soft, flat, nontender, nondistended, without organomegaly.   Musculoskeletal:  no spinal tenderness of palpation of vertebral spine.   Skin exam was without echymosis, petichae.   Neuro exam was nonfocal. Patient was alert and oriented.  Attention was good.   Language was appropriate.  Mood was normal without depression.  Speech was not pressured.  Thought content was not tangential.     Lab Results  Component Value Date   WBC 7.2 02/17/2019   HGB 8.6 (L) 02/17/2019   HCT 30.5 (L) 02/17/2019   PLT 549 (H) 02/17/2019   GLUCOSE 95 02/17/2019   ALT 13 02/15/2019   AST 13 (L) 02/15/2019   NA 137 02/17/2019   K 3.8 02/17/2019   CL 107 02/17/2019   CREATININE 0.75 02/17/2019   BUN 10 02/17/2019   CO2 22 02/17/2019    Ct Entero Abd/pelvis W Contast  Result Date: 02/15/2019 CLINICAL DATA:  Inpatient. Epigastric abdominal pain and symptomatic anemia, with episodes of bright red blood per rectum for several months. History of duodenal adenoma. EXAM: CT ABDOMEN AND PELVIS WITH CONTRAST (ENTEROGRAPHY) TECHNIQUE: Multidetector CT of the abdomen and pelvis during bolus administration of intravenous contrast. Negative oral contrast was given. CONTRAST:  161mL OMNIPAQUE  IOHEXOL 300 MG/ML  SOLN COMPARISON:  None. FINDINGS: Lower chest: No significant pulmonary nodules or acute consolidative airspace disease. Hepatobiliary: Normal liver size. No liver mass. Normal gallbladder with no radiopaque cholelithiasis. No biliary ductal dilatation. Pancreas: Normal, with no mass or duct dilation. Spleen: Normal size. No mass. Adrenals/Urinary Tract: Normal adrenals. Normal kidneys with no hydronephrosis and no renal mass. Normal bladder. Stomach/Bowel: Normal non-distended stomach. Normal caliber small bowel. No small bowel wall thickening or mucosal hyperenhancement. No discrete small bowel mass. No pneumatosis. No appreciable bowel fistula. Appendectomy. Mobile cecum located in the right upper quadrant. There is segmental circumferential wall thickening at the junction of the descending and sigmoid colon in the left lower quadrant (series 4/image 141 and series 7/image 29). No additional sites of large bowel wall thickening. No significant colonic diverticulosis. Mild-to-moderate colonic stool. Vascular/Lymphatic: Normal caliber abdominal aorta. Borderline prominent 1.0 cm left inguinal node (series 4/image 197). No additional pathologically enlarged lymph nodes in the abdomen or pelvis. Reproductive: Grossly normal anteverted uterus. No left adnexal mass. There is a 4.3 x 4.3 cm right adnexal mass with mildly irregular peripheral wall thickening and enhancement and central low attenuation (series 4/image 162). Other: Small amount of simple free fluid in the pelvic cul-de-sac. No focal fluid collection. No pneumoperitoneum. Musculoskeletal: No aggressive appearing focal osseous lesions. IMPRESSION: 1. Segmental circumferential wall thickening at the junction of the descending and sigmoid colon, indeterminate, differential includes inflammatory or neoplastic etiologies. GI consultation for colonoscopy correlation suggested. 2. No bowel obstruction.  No abscess.  No fistula.  No free air. 3.  Indeterminate 4.3 cm right adnexal lesion with irregular peripheral wall thickening and central low attenuation. Small volume free fluid in the pelvic cul-de-sac. Findings may represent corpus luteal cyst, however ultrasound correlation advised to exclude other etiologies. Electronically Signed   By: Ilona Sorrel M.D.   On: 02/15/2019 15:24   US Pelvic Complete With Transvaginal  Result Date: 02/16/2019 CLINICAL DATA:  Evaluate right adnexal mass EXAM: TRANSABDOMINAL AND TRANSVAGINAL ULTRASOUND OF PELVIS DOPPLER  ULTRASOUND OF OVARIES TECHNIQUE: Both transabdominal and transvaginal ultrasound examinations of the pelvis were performed. Transabdominal technique was performed for global imaging of the pelvis including uterus, ovaries, adnexal regions, and pelvic cul-de-sac. It was necessary to proceed with endovaginal exam following the transabdominal exam to visualize the ovaries and adnexa. Color and duplex Doppler ultrasound was utilized to evaluate blood flow to the ovaries. COMPARISON:  CT enterography abdomen and pelvis, 02/15/2019 FINDINGS: Uterus Measurements: 8.7 x 4.8 x 5.5 cm = volume: 119 mL. No fibroids or other mass visualized. Endometrium Thickness: 8 mm.  No focal abnormality visualized. Right ovary Measurements: 5.8 x 5.2 x 4.9 cm = volume: 79 mL. The right ovary is enlarged by a dominant cyst measuring 3.9 x 3.5 x 3.5 cm with multiple smaller cysts and follicles. Left ovary Measurements: 4.0 x 2.3 x 2.1 cm = volume: 10 mL. Multiple small follicles. Pulsed Doppler evaluation of both ovaries demonstrates normal low-resistance arterial and venous waveforms. Other findings Trace free fluid in the pelvis. IMPRESSION: 1. The right ovary is enlarged by a dominant cyst measuring 3.9 cm, in keeping with prior CT findings and almost certainly a benign functional cyst/follicular remnant in the reproductive age setting. Consider follow-up ultrasound at 6 weeks to assess for stability or resolution. 2.  Arterial and venous Doppler flow are present to the ovaries bilaterally, however intermittent or incomplete ovarian torsion cannot be strictly excluded in any enlarged ovary if there is acutely referable pain. 3.  Trace, nonspecific free fluid in the pelvis. Electronically Signed   By: Eddie Candle M.D.   On: 02/16/2019 09:45     Ct Entero Abd/pelvis W Contast  Result Date: 02/15/2019 CLINICAL DATA:  Inpatient. Epigastric abdominal pain and symptomatic anemia, with episodes of bright red blood per rectum for several months. History of duodenal adenoma. EXAM: CT ABDOMEN AND PELVIS WITH CONTRAST (ENTEROGRAPHY) TECHNIQUE: Multidetector CT of the abdomen and pelvis during bolus administration of intravenous contrast. Negative oral contrast was given. CONTRAST:  134mL OMNIPAQUE IOHEXOL 300 MG/ML  SOLN COMPARISON:  None. FINDINGS: Lower chest: No significant pulmonary nodules or acute consolidative airspace disease. Hepatobiliary: Normal liver size. No liver mass. Normal gallbladder with no radiopaque cholelithiasis. No biliary ductal dilatation. Pancreas: Normal, with no mass or duct dilation. Spleen: Normal size. No mass. Adrenals/Urinary Tract: Normal adrenals. Normal kidneys with no hydronephrosis and no renal mass. Normal bladder. Stomach/Bowel: Normal non-distended stomach. Normal caliber small bowel. No small bowel wall thickening or mucosal hyperenhancement. No discrete small bowel mass. No pneumatosis. No appreciable bowel fistula. Appendectomy. Mobile cecum located in the right upper quadrant. There is segmental circumferential wall thickening at the junction of the descending and sigmoid colon in the left lower quadrant (series 4/image 141 and series 7/image 29). No additional sites of large bowel wall thickening. No significant colonic diverticulosis. Mild-to-moderate colonic stool. Vascular/Lymphatic: Normal caliber abdominal aorta. Borderline prominent 1.0 cm left inguinal node (series 4/image 197).  No additional pathologically enlarged lymph nodes in the abdomen or pelvis. Reproductive: Grossly normal anteverted uterus. No left adnexal mass. There is a 4.3 x 4.3 cm right adnexal mass with mildly irregular peripheral wall thickening and enhancement and central low attenuation (series 4/image 162). Other: Small amount of simple free fluid in the pelvic cul-de-sac. No focal fluid collection. No pneumoperitoneum. Musculoskeletal: No aggressive appearing focal osseous lesions. IMPRESSION: 1. Segmental circumferential wall thickening at the junction of the descending and sigmoid colon, indeterminate, differential includes inflammatory or neoplastic etiologies. GI consultation for colonoscopy correlation suggested. 2.  No bowel obstruction.  No abscess.  No fistula.  No free air. 3. Indeterminate 4.3 cm right adnexal lesion with irregular peripheral wall thickening and central low attenuation. Small volume free fluid in the pelvic cul-de-sac. Findings may represent corpus luteal cyst, however ultrasound correlation advised to exclude other etiologies. Electronically Signed   By: Ilona Sorrel M.D.   On: 02/15/2019 15:24   US Pelvic Complete With Transvaginal  Result Date: 02/16/2019 CLINICAL DATA:  Evaluate right adnexal mass EXAM: TRANSABDOMINAL AND TRANSVAGINAL ULTRASOUND OF PELVIS DOPPLER ULTRASOUND OF OVARIES TECHNIQUE: Both transabdominal and transvaginal ultrasound examinations of the pelvis were performed. Transabdominal technique was performed for global imaging of the pelvis including uterus, ovaries, adnexal regions, and pelvic cul-de-sac. It was necessary to proceed with endovaginal exam following the transabdominal exam to visualize the ovaries and adnexa. Color and duplex Doppler ultrasound was utilized to evaluate blood flow to the ovaries. COMPARISON:  CT enterography abdomen and pelvis, 02/15/2019 FINDINGS: Uterus Measurements: 8.7 x 4.8 x 5.5 cm = volume: 119 mL. No fibroids or other mass  visualized. Endometrium Thickness: 8 mm.  No focal abnormality visualized. Right ovary Measurements: 5.8 x 5.2 x 4.9 cm = volume: 79 mL. The right ovary is enlarged by a dominant cyst measuring 3.9 x 3.5 x 3.5 cm with multiple smaller cysts and follicles. Left ovary Measurements: 4.0 x 2.3 x 2.1 cm = volume: 10 mL. Multiple small follicles. Pulsed Doppler evaluation of both ovaries demonstrates normal low-resistance arterial and venous waveforms. Other findings Trace free fluid in the pelvis. IMPRESSION: 1. The right ovary is enlarged by a dominant cyst measuring 3.9 cm, in keeping with prior CT findings and almost certainly a benign functional cyst/follicular remnant in the reproductive age setting. Consider follow-up ultrasound at 6 weeks to assess for stability or resolution. 2. Arterial and venous Doppler flow are present to the ovaries bilaterally, however intermittent or incomplete ovarian torsion cannot be strictly excluded in any enlarged ovary if there is acutely referable pain. 3.  Trace, nonspecific free fluid in the pelvis. Electronically Signed   By: Eddie Candle M.D.   On: 02/16/2019 09:45   Assessment and Plan:  1.  Abnormal CT scan and colonoscopy concerning for colon cancer 2. ?FAP 3.  Iron deficiency anemia 4.  Ovarian cyst  -Given the patient's CT scan findings and colonoscopy results, suspect that the patient has a new diagnosis of colon cancer.  Will await pathology results.  Further recommendations pending the results of the biopsy. -The patient has tested I think when they are teenagers repeated as an outpatient. -Recommend outpatient follow-up with gynecology for the ovarian cyst.  Thank you for this referral.   Mikey Bussing, DNP, AGPCNP-BC, AOCNP  ADDENDUM: I saw and examined Briana Powers.  She is incredibly nice.  She has 3 children.  They are under 43 years old at the present time.  However, they clearly will have to be tested in their teenage years with colonoscopies.  I  am sure that she has passed on the gene to each of them.  We do not have the path yet from the biopsy.  I would have to believe that this is going to be positive for malignancy.  Her mother died age 82 metastatic colon cancer.  Her iron is incredibly low.  Her ferritin is only 1 with an iron saturation of 2%.  I will go ahead and give her iron dextran.  She is in the hospital.  I give a lot more iron  with iron dextran and I think this will help with her blood particularly if she needs surgery.  As far as surgery, I will defer to my surgical colleagues as to how best to manage this once the biopsy comes back positive for colon cancer.  I do not know if she would need a total proctocolectomy.  I will know if she could have a colectomy and then have it attached so she will not need a permanent colostomy bag.  As far as any type of adjuvant therapy, this will really be dictated by surgery and what is found at the time of surgery.  I do not think she has any extracolonic manifestations of this FAP.  I will see anything on exam that would suggest Gardner's syndrome or Turcot's syndrome.  It will be interesting to see what her CEA level is.  She does not look like a big woman.  She is not lost weight.  She does not smoke.  She has an occasional alcoholic drink.  She is going to the bathroom okay.  It would be nice to try to get her blood count little bit better and get iron back into her before she has any surgery.  I spent about 45 minutes with Briana Powers.  She is very very nice.  She certainly understands very well what is going on and what needs to be done if the biopsy comes back positive for malignancy.  We will follow her along.  I will give her iron dextran in the morning.  Lattie Haw, MD  Jeneen Rinks 1:5-7

## 2019-02-18 NOTE — Interval H&P Note (Signed)
History and Physical Interval Note: 28/emale with severe symptomatic IDA, left colon wall thickening on CT enterography, last colonoscopy was in 07/2018 with poor prep, incomplete as per patient, possible FAP/genetic polyposis syndrome.  02/18/2019 12:02 PM  Briana Powers  has presented today for colonoscopy, with the diagnosis of Anemia; Abdominal pain; Abnormal CT scan.  The various methods of treatment have been discussed with the patient and family. After consideration of risks, benefits and other options for treatment, the patient has consented to  Procedure(s): COLONOSCOPY WITH PROPOFOL (N/A) as a surgical intervention.  The patient's history has been reviewed, patient examined, no change in status, stable for surgery.  I have reviewed the patient's chart and labs.  Questions were answered to the patient's satisfaction.     Ronnette Juniper

## 2019-02-19 LAB — CBC
HCT: 34.4 % — ABNORMAL LOW (ref 36.0–46.0)
Hemoglobin: 9.7 g/dL — ABNORMAL LOW (ref 12.0–15.0)
MCH: 19.8 pg — ABNORMAL LOW (ref 26.0–34.0)
MCHC: 28.2 g/dL — ABNORMAL LOW (ref 30.0–36.0)
MCV: 70.3 fL — ABNORMAL LOW (ref 80.0–100.0)
Platelets: 378 10*3/uL (ref 150–400)
RBC: 4.89 MIL/uL (ref 3.87–5.11)
RDW: 26.6 % — ABNORMAL HIGH (ref 11.5–15.5)
WBC: 5.5 10*3/uL (ref 4.0–10.5)
nRBC: 0 % (ref 0.0–0.2)

## 2019-02-19 LAB — BASIC METABOLIC PANEL
Anion gap: 9 (ref 5–15)
BUN: 8 mg/dL (ref 6–20)
CO2: 22 mmol/L (ref 22–32)
Calcium: 9.1 mg/dL (ref 8.9–10.3)
Chloride: 105 mmol/L (ref 98–111)
Creatinine, Ser: 0.79 mg/dL (ref 0.44–1.00)
GFR calc Af Amer: >60 mL/min (ref >60)
GFR calc non Af Amer: >60 mL/min (ref >60)
Glucose, Bld: 83 mg/dL (ref 70–99)
Potassium: 3.8 mmol/L (ref 3.5–5.1)
Sodium: 136 mmol/L (ref 135–145)

## 2019-02-19 LAB — SURGICAL PATHOLOGY

## 2019-02-19 LAB — PREALBUMIN: Prealbumin: 18 mg/dL (ref 18–38)

## 2019-02-19 MED ORDER — PRO-STAT SUGAR FREE PO LIQD
30.0000 mL | Freq: Two times a day (BID) | ORAL | Status: DC
  Administered 2019-02-19 – 2019-02-23 (×8): 30 mL via ORAL
  Filled 2019-02-19 (×8): qty 30

## 2019-02-19 MED ORDER — BOOST / RESOURCE BREEZE PO LIQD CUSTOM
1.0000 | Freq: Three times a day (TID) | ORAL | Status: DC
  Administered 2019-02-19 – 2019-02-23 (×11): 1 via ORAL

## 2019-02-19 NOTE — Progress Notes (Addendum)
1 Day Post-Op    CC: Epigastric pain/anemia  Subjective: Patient is doing well this a.m.  She is waiting on her family be able to come up.  No pain or discomfort.  Objective: Vital signs in last 24 hours: Temp:  [97.5 F (36.4 C)-99.1 F (37.3 C)] 98.1 F (36.7 C) (11/17 0544) Pulse Rate:  [59-73] 59 (11/17 0544) Resp:  [14-23] 20 (11/17 0544) BP: (96-114)/(55-78) 96/55 (11/17 0544) SpO2:  [97 %-100 %] 99 % (11/17 0544) Last BM Date: 02/18/19 224 PO 694 IV 300 urine recorded Afebrile  VSS Labs OK  H/H up to 9.7/34.4 SURGICAL PATHOLOGY  CASE: [id number]  PATIENT: Briana Powers  Surgical Pathology Report  Clinical History: Anemia, abdominal pain, abnormal ct scan ([facility])  FINAL MICROSCOPIC DIAGNOSIS:  A. 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  COMMENT:  The fragments are superficial and definitive invasive adenocarcinoma is  not seen. The case was called to Briana Powers nurse on 02/19/2019.    Intake/Output from previous day: 11/16 0701 - 11/17 0700 In: 938.6 [P.O.:244; I.V.:694.6] Out: 300 [Urine:300] Intake/Output this shift: No intake/output data recorded.  General appearance: alert, cooperative and no distress GI: soft, non-tender; bowel sounds normal; no masses,  no organomegaly  Lab Results:  Recent Labs    02/17/19 0517 02/19/19 0611  WBC 7.2 5.5  HGB 8.6* 9.7*  HCT 30.5* 34.4*  PLT 549* 378    BMET Recent Labs    02/17/19 0517 02/19/19 0611  NA 137 136  K 3.8 3.8  CL 107 105  CO2 22 22  GLUCOSE 95 83  BUN 10 8  CREATININE 0.75 0.79  CALCIUM 8.4* 9.1   PT/INR No results for input(s): LABPROT, INR in the last 72 hours.  Recent Labs  Lab 02/14/19 1926 02/15/19 0407  AST 17 13*  ALT 15 13  ALKPHOS 35* 28*  BILITOT 0.7 0.8  PROT 7.6 6.3*  ALBUMIN 3.8 3.3*     Lipase     Component Value Date/Time   LIPASE 41 02/14/2019 1926     Medications:   . sodium  chloride Stopped (02/18/19 0411)  . iron dextran (INFED/DEXFERRUM) infusion     Followed by  . iron dextran (INFED/DEXFERRUM) infusion    . 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   FEN: Clear liquids ID:  None DVT:  SCD Follow up:  TBD  Plan: We will review with GI and Briana Powers to discuss the next step.  Continue clear liquids, nutrition consult requested.  Prealbumin is 18, which is the low normal 18-38.   LOS: 3 days    Briana Powers 02/19/2019 Please see Amion

## 2019-02-19 NOTE — Progress Notes (Signed)
Subjective: Patient states she had 2 loose bowel movements today.  Discussed pathology report from sigmoid mass.  Objective: Vital signs in last 24 hours: Temp:  [98.1 F (36.7 C)-98.4 F (36.9 C)] 98.4 F (36.9 C) (11/17 1241) Pulse Rate:  [59-69] 61 (11/17 1241) Resp:  [19-20] 20 (11/17 1241) BP: (96-114)/(55-83) 104/83 (11/17 1241) SpO2:  [99 %-100 %] 100 % (11/17 1241) Weight change:  Last BM Date: 02/18/19  PE: Thinly built, prominent pallor GENERAL: Not in distress ABDOMEN: Soft, nondistended EXTREMITIES: No deformity  Lab Results: Results for orders placed or performed during the hospital encounter of 02/14/19 (from the past 48 hour(s))  Surgical pathology     Status: None   Collection Time: 02/18/19 12:22 PM  Result Value Ref Range   SURGICAL PATHOLOGY      SURGICAL PATHOLOGY CASE: [id number] PATIENT: Briana Powers Surgical Pathology Report     Clinical History: Anemia, abdominal pain, abnormal ct scan (jmc)     FINAL MICROSCOPIC DIAGNOSIS:  A. 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  COMMENT:  The fragments are superficial and definitive invasive adenocarcinoma is not seen. The case was called to Dr. Encarnacion Slates nurse on 02/19/2019.  GROSS DESCRIPTION:  Received in formalin are tan, soft tissue fragments that are submitted in toto. Number: Multiple size: Range from 0.1 to 0.3 cm blocks: 1 Craig Staggers 02/19/2019)    Final Diagnosis performed by Vicente Males, MD.   Electronically signed 02/19/2019 Technical and / or Professional components performed at [facility], [place] [address], [place], [place] [address].  Immunohistochemistry Technical component (if applicable) was performed at [facility] [address]  [place], [place], [place] [address].   IMMUNOHISTOCHEMISTRY DISCLAIMER (if applicable): Some of these immunohistochemical stains may have been  developed and the performance characteristics determine by [facility]. Some may not have been cleared or approved by the U.S. Food and Drug Administration. The FDA has determined that such clearance or approval is not necessary. This test is used for clinical purposes. It should not be regarded as investigational or for research. This laboratory is certified under the [place] (CLIA-88) as qualified to perform high complexity clinical laboratory testing.  The controls stained appropriately.   CBC in AM     Status: Abnormal   Collection Time: 02/19/19  6:11 AM  Result Value Ref Range   WBC 5.5 4.0 - 10.5 K/uL   RBC 4.89 3.87 - 5.11 MIL/uL   Hemoglobin 9.7 (L) 12.0 - 15.0 g/dL   HCT 34.4 (L) 36.0 - 46.0 %   MCV 70.3 (L) 80.0 - 100.0 fL   MCH 19.8 (L) 26.0 - 34.0 pg   MCHC 28.2 (L) 30.0 - 36.0 g/dL   RDW 26.6 (H) 11.5 - 15.5 %   Platelets 378 150 - 400 K/uL   nRBC 0.0 0.0 - 0.2 %    Comment: Performed at [facility], [place] [address] [place], [place] [address]  BMP in AM     Status: None   Collection Time: 02/19/19  6:11 AM  Result Value Ref Range   Sodium 136 135 - 145 mmol/L   Potassium 3.8 3.5 - 5.1 mmol/L   Chloride 105 98 - 111 mmol/L   CO2 22 22 - 32 mmol/L   Glucose, Bld 83 70 - 99 mg/dL   BUN 8 6 - 20 mg/dL   Creatinine, Ser 0.79 0.44 - 1.00 mg/dL  Calcium 9.1 8.9 - 10.3 mg/dL   GFR calc non Af Amer >60 >60 mL/min   GFR calc Af Amer >60 >60 mL/min   Anion gap 9 5 - 15    Comment: Performed at [facility], [place] [address], [place], [place] [address]  Prealbumin     Status: None   Collection Time: 02/19/19  6:11 AM  Result Value Ref Range   Prealbumin 18.0 18 - 38 mg/dL    Comment: Performed at [facility], [place] [address] [place], [place] [address]    Studies/Results: No results found.  Medications: I have reviewed the patient's current  medications.  Assessment: Obstructive sigmoid mass noted on sigmoidoscopy yesterday, unable to advance the scope beyond the mass. 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 Patient noted to have innumerable polyps in the rectum and sigmoid up to 25 cm Her mother had colon cancer at 55 or 33 years of age and her twin brother required total colectomy with ileostomy when he turned 42, overall clinical picture and family history suggestive of familial adenomatous polyposis syndrome.  Plan: Recommend genetic counseling, testing for FAP. If this is FAP, patient will require total proctocolectomy with ileostomy, discussed the same with the patient as well as surgeon Dr. Hassell Done.  Ronnette Juniper, MD 02/19/2019, 2:18 PM

## 2019-02-19 NOTE — Progress Notes (Signed)
Initial Nutrition Assessment  DOCUMENTATION CODES:   Not applicable  INTERVENTION:   - Boost Breeze po TID, each supplement provides 250 kcal and 9 grams of protein  - Pro-stat 30 ml po BID, each supplement provides 100 kcal and 15 grams of protein  - Encourage adequate PO intake at meals  - RD will monitor for diet advancement and adjust supplement regimen as appropriate  NUTRITION DIAGNOSIS:   Inadequate oral intake related to altered GI function as evidenced by other (clear liquid diet order).  GOAL:   Patient will meet greater than or equal to 90% of their needs  MONITOR:   PO intake, Supplement acceptance, Diet advancement, Labs, Weight trends  REASON FOR ASSESSMENT:   Consult Other ("Patient will most likely need a colon resection. We have her on clears. Can you help with improving protein and calorie intake prior to surgery.")  ASSESSMENT:   28 year old female who presented to the ED on 11/12 with anemia and epigastric pain. PMH of PUD.   11/14 - CT enterography showing colonic wall thickening at the junction of descending colon and sigmoid colon 11/16 - colonoscopy showing innumerable polyps in the rectum and likely malignant completely obstructing tumor in the sigmoid colon  Pt with possible FAP.  Pt remains on clear liquid diet.  Spoke with pt at bedside. Pt denies any abdominal pain, N/V at this time. Pt endorses having a BM today.  Pt reports that she has been "avoiding eating" because she is tired of juice and broth. RD discussed the importance of adequate PO intake prior to any surgical procedure to aid in healing post-op. Pt expresses understanding.  Pt states that at home, she has a good appetite and eats "whenever I get hungry." Pt has 3 small children and feeds them breakfast, lunch, and dinner at normal times but may eat at other times based on what her schedule allows. Pt states she tries to follow a healthy diet and eats a lot of baked foods like  baked chicken, baked tilapia, baked spaghetti, etc. Pt states she also eats a lot of salads.  Pt denies any recent weight loss and reports her UBW as 110-115 lbs. Reviewed weights in chart. Pt's weight has been stable over the last 11 months.  RD discussed oral nutrition supplement options to help pt maximize kcal and protein intake and overall nutrition status pre-op. Pt willing to try both Boost Breeze and Pro-stat. RD to order. RD encouraged pt to push herself to consume clear liquid meal trays to maximize nutrition.  Meal Completion: 50-100% x 4 meals (clear liquids)  Medications reviewed and include: iron dextran complex  Labs reviewed: hemoglobin 9.7  NUTRITION - FOCUSED PHYSICAL EXAM:  Deferred.  Diet Order:   Diet Order            Diet clear liquid Room service appropriate? Yes; Fluid consistency: Thin  Diet effective now              EDUCATION NEEDS:   Education needs have been addressed  Skin:  Skin Assessment: Reviewed RN Assessment  Last BM:  02/18/19  Height:   Ht Readings from Last 1 Encounters:  02/15/19 5\' 5"  (1.651 m)    Weight:   Wt Readings from Last 1 Encounters:  02/18/19 51.3 kg    Ideal Body Weight:  56.8 kg  BMI:  Body mass index is 18.82 kg/m.  Estimated Nutritional Needs:   Kcal:  1700-1900  Protein:  80-95 grams  Fluid:  >/= 1.7  L    Gaynell Face, MS, RD, LDN Inpatient Clinical Dietitian Pager: [phone] Weekend/After Hours: [phone]

## 2019-02-19 NOTE — Progress Notes (Signed)
PROGRESS NOTE    Briana Powers  [id number] DOB: December 26, 1990 DOA: 02/14/2019 PCP: Patient, No Pcp Per   Brief Narrative:  Patient is a 28 year old female with family history of FAP who presents to the emergency room with complaints of abdomen pain, weakness.  She was found to have hemoglobin of 6.4 with microcytic anemia.  Patient was transfused 2 units of PRBC with good response.  Patient was recently hospitalized at [facility] for microcytic anemia with Hemoccult positive stool, EGD and colonoscopy were not fully done  secondary to poor preparation but she was noted to have multiple polyps and there was concern for FAP.  She did not follow-up with them as she lost her insurance.  Patient evaluated by GI here and underwent colonoscopy on 11/16 which showed sigmoid mass, multiple polyps, highly suspicious for FAP.  Biopsy showed  tubulovillous adenoma with high grade dysplasia.Genral surgery following for possible plan for proctocolectomy.  Oncology also following.  Assessment & Plan:   Active Problems:   Symptomatic anemia   Symptomatic anemia: Secondary to chronic blood loss.  Has microcytic anemia.  Being given iron.  Presented with epigastric abdominal pain.  Last endoscopy showed peptic ulcer disease.  She was transfused units of PRBC.  Currently H&H stable.  Colon polyps: Has family history of FAP syndrome.  Her mother and twin brother were diagnosed with FAP.  Her mother expired due to colon cancer.  Her brother already had colectomy. CT enterography showed chronic wall thickening at descending colon/sigmoid colon junction.  Underwent sigmoidoscopy with finding of obstructive sigmoid mass, unable to advance the scope beyond the mass.  Multiple polyps in the rectum and sigmoid.  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  General surgery following for possible plan for proctocolectomy. Oncology also following.    Right ovarian cyst: Currently stable.   Follow-up with GYN as an outpatient.          DVT prophylaxis:SCD Code Status: Full Family Communication: None present at the bedside Disposition Plan: Home after full work-up   Consultants: GI, general surgery, oncology  Procedures: Sigmoidoscopy  Antimicrobials:  Anti-infectives (From admission, onward)   None      Subjective: Patient seen and examined the bedside this afternoon.  Currently is comfortable.  Hemodynamically stable.  Denies any abdominal pain, nausea or vomiting.  Has been having bowel movements.  Currently on clear liquid diet.  Objective: Vitals:   02/18/19 1544 02/18/19 2059 02/19/19 0544 02/19/19 1241  BP: 108/78 114/72 (!) 96/55 104/83  Pulse: 61 69 (!) 59 61  Resp: 20 19 20 20   Temp: 98.3 F (36.8 C) 98.4 F (36.9 C) 98.1 F (36.7 C) 98.4 F (36.9 C)  TempSrc: Oral Oral Oral Oral  SpO2: 100% 100% 99% 100%  Weight:      Height:        Intake/Output Summary (Last 24 hours) at 02/19/2019 1525 Last data filed at 02/19/2019 [id number] Gross per 24 hour  Intake 244 ml  Output -  Net 244 ml   Filed Weights   02/14/19 1903 02/15/19 1530 02/18/19 0523  Weight: 50.8 kg 52.5 kg 51.3 kg    Examination:  General exam: Appears calm and comfortable ,Not in distress,average built HEENT:PERRL,Oral mucosa moist, Ear/Nose normal on gross exam Respiratory system: Bilateral equal air entry, normal vesicular breath sounds, no wheezes or crackles  Cardiovascular system: S1 & S2 heard, RRR. No JVD, murmurs, rubs, gallops or clicks. No pedal edema. Gastrointestinal system: Abdomen is nondistended, soft and  nontender. No organomegaly or masses felt. Normal bowel sounds heard. Central nervous system: Alert and oriented. No focal neurological deficits. Extremities: No edema, no clubbing ,no cyanosis, distal peripheral pulses palpable. Skin: No rashes, lesions or ulcers,no icterus ,no pallor   Data Reviewed: I have personally reviewed following labs and  imaging studies  CBC: Recent Labs  Lab 02/14/19 1926  02/15/19 0407  02/16/19 0057 02/16/19 1121 02/16/19 1828 02/17/19 0517 02/19/19 0611  WBC 6.5  --  7.8  --   --  7.7  --  7.2 5.5  NEUTROABS 3.8  --   --   --   --   --   --   --   --   HGB 6.4*  --  7.2*   < > 8.9* 9.3* 9.5* 8.6* 9.7*  HCT 24.6*   < > 26.0*   < > 31.0* 34.0* 33.9* 30.5* 34.4*  MCV 64.1*  --  67.9*  --   --  70.7*  --  70.6* 70.3*  PLT 972*  --  762*  --   --  635*  --  549* 378   < > = values in this interval not displayed.   Basic Metabolic Panel: Recent Labs  Lab 02/14/19 1926 02/15/19 0407 02/16/19 1121 02/17/19 0517 02/19/19 0611  NA 138 137 139 137 136  K 3.7 4.0 3.6 3.8 3.8  CL 105 107 108 107 105  CO2 26 22 22 22 22   GLUCOSE 94 84 105* 95 83  BUN 14 12 8 10 8   CREATININE 0.99 0.86 0.74 0.75 0.79  CALCIUM 9.2 8.6* 8.7* 8.4* 9.1   GFR: Estimated Creatinine Clearance: 86.3 mL/min (by C-G formula based on SCr of 0.79 mg/dL). Liver Function Tests: Recent Labs  Lab 02/14/19 1926 02/15/19 0407  AST 17 13*  ALT 15 13  ALKPHOS 35* 28*  BILITOT 0.7 0.8  PROT 7.6 6.3*  ALBUMIN 3.8 3.3*   Recent Labs  Lab 02/14/19 1926  LIPASE 41   No results for input(s): AMMONIA in the last 168 hours. Coagulation Profile: Recent Labs  Lab 02/15/19 0407  INR 1.1   Cardiac Enzymes: No results for input(s): CKTOTAL, CKMB, CKMBINDEX, TROPONINI in the last 168 hours. BNP (last 3 results) No results for input(s): PROBNP in the last 8760 hours. HbA1C: No results for input(s): HGBA1C in the last 72 hours. CBG: No results for input(s): GLUCAP in the last 168 hours. Lipid Profile: No results for input(s): CHOL, HDL, LDLCALC, TRIG, CHOLHDL, LDLDIRECT in the last 72 hours. Thyroid Function Tests: No results for input(s): TSH, T4TOTAL, FREET4, T3FREE, THYROIDAB in the last 72 hours. Anemia Panel: No results for input(s): VITAMINB12, FOLATE, FERRITIN, TIBC, IRON, RETICCTPCT in the last 72 hours. Sepsis  Labs: No results for input(s): PROCALCITON, LATICACIDVEN in the last 168 hours.  Recent Results (from the past 240 hour(s))  Wet prep, genital     Status: Abnormal   Collection Time: 02/14/19  8:11 PM   Specimen: Cervix  Result Value Ref Range Status   Yeast Wet Prep HPF POC NONE SEEN NONE SEEN Final   Trich, Wet Prep NONE SEEN NONE SEEN Final   Clue Cells Wet Prep HPF POC PRESENT (A) NONE SEEN Final   WBC, Wet Prep HPF POC MANY (A) NONE SEEN Final   Sperm NONE SEEN  Final    Comment: Performed at [facility], [address] [place], [place] [address]  SARS CORONAVIRUS 2 (TAT 6-24 HRS) Nasopharyngeal Nasopharyngeal  Swab     Status: None   Collection Time: 02/14/19  9:15 PM   Specimen: Nasopharyngeal Swab  Result Value Ref Range Status   SARS Coronavirus 2 NEGATIVE NEGATIVE Final    Comment: (NOTE) SARS-CoV-2 target nucleic acids are NOT DETECTED. The SARS-CoV-2 RNA is generally detectable in upper and lower respiratory specimens during the acute phase of infection. Negative results do not preclude SARS-CoV-2 infection, do not rule out co-infections with other pathogens, and should not be used as the sole basis for treatment or other patient management decisions. Negative results must be combined with clinical observations, patient history, and epidemiological information. The expected result is Negative. Fact Sheet for Patients: SugarRoll.be Fact Sheet for Healthcare Providers: https://www.woods-mathews.com/ This test is not yet approved or cleared by the [place] FDA and  has been authorized for detection and/or diagnosis of SARS-CoV-2 by FDA under an Emergency Use Authorization (EUA). This EUA will remain  in effect (meaning this test can be used) for the duration of the COVID-19 declaration under Section 56 4(b)(1) of the Act, 21 U.S.C. section 360bbb-3(b)(1), unless the authorization is terminated or revoked sooner.  Performed at [facility], [place] [address], [place], [place] [address]   MRSA PCR Screening     Status: None   Collection Time: 02/14/19 11:27 PM   Specimen: Nasal Mucosa; Nasopharyngeal  Result Value Ref Range Status   MRSA by PCR NEGATIVE NEGATIVE Final    Comment:        The GeneXpert MRSA Assay (FDA approved for NASAL specimens only), is one component of a comprehensive MRSA colonization surveillance program. It is not intended to diagnose MRSA infection nor to guide or monitor treatment for MRSA infections. Performed at [facility], [place] [address], [place], [place] [address]          Radiology Studies: No results found.      Scheduled Meds: . feeding supplement  1 Container Oral TID BM  . feeding supplement (PRO-STAT SUGAR FREE 64)  30 mL Oral BID   Continuous Infusions: . sodium chloride 10 mL/hr at 02/19/19 1458  . iron dextran (INFED/DEXFERRUM) infusion    . lactated ringers Stopped (02/18/19 1300)     LOS: 3 days    Time spent: 25 mins.More than 50% of that time was spent in counseling and/or coordination of care.      Shelly Coss, MD [facility] Pager [phone]  If 7PM-7AM, please contact night-coverage [email or web address] Password [email or web address] 02/19/2019, 3:25 PM

## 2019-02-20 LAB — CEA: CEA: 8 ng/mL — ABNORMAL HIGH (ref 0.0–4.7)

## 2019-02-20 MED ORDER — POLYETHYLENE GLYCOL 3350 17 G PO PACK
17.0000 g | PACK | Freq: Every day | ORAL | Status: DC
  Administered 2019-02-20 – 2019-02-23 (×4): 17 g via ORAL
  Filled 2019-02-20 (×4): qty 1

## 2019-02-20 NOTE — Progress Notes (Signed)
Subjective: Patient denies abdominal pain.  Continues to be on clear liquid diet.  Objective: Vital signs in last 24 hours: Temp:  [98.2 F (36.8 C)-98.7 F (37.1 C)] 98.2 F (36.8 C) (11/18 0650) Pulse Rate:  [61-82] 75 (11/18 0650) Resp:  [19-20] 19 (11/18 0650) BP: (101-104)/(64-83) 104/64 (11/18 0650) SpO2:  [100 %] 100 % (11/18 0650) Weight:  [53.3 kg] 53.3 kg (11/18 0700) Weight change:  Last BM Date: 02/18/19  PE: Thinly built, not in distress GENERAL: Appears comfortable ABDOMEN: Nondistended EXTREMITIES: No deformity  Lab Results: Results for orders placed or performed during the hospital encounter of 02/14/19 (from the past 48 hour(s))  Surgical pathology     Status: None   Collection Time: 02/18/19 12:22 PM  Result Value Ref Range   SURGICAL PATHOLOGY      SURGICAL PATHOLOGY CASE: [id number] PATIENT: Briana Powers Surgical Pathology Report     Clinical History: Anemia, abdominal pain, abnormal ct scan (jmc)     FINAL MICROSCOPIC DIAGNOSIS:  A. 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  COMMENT:  The fragments are superficial and definitive invasive adenocarcinoma is not seen. The case was called to Dr. Encarnacion Slates nurse on 02/19/2019.  GROSS DESCRIPTION:  Received in formalin are tan, soft tissue fragments that are submitted in toto. Number: Multiple size: Range from 0.1 to 0.3 cm blocks: 1 Craig Staggers 02/19/2019)    Final Diagnosis performed by Vicente Males, MD.   Electronically signed 02/19/2019 Technical and / or Professional components performed at [facility], [place] [address] [place], [place] [address].  Immunohistochemistry Technical component (if applicable) was performed at [facility]. [address]  [place], [place], [place] [address].   IMMUNOHISTOCHEMISTRY DISCLAIMER (if applicable): Some of these immunohistochemical stains may have been  developed and the performance characteristics determine by [facility]. Some may not have been cleared or approved by the U.S. Food and Drug Administration. The FDA has determined that such clearance or approval is not necessary. This test is used for clinical purposes. It should not be regarded as investigational or for research. This laboratory is certified under the [place] (CLIA-88) as qualified to perform high complexity clinical laboratory testing.  The controls stained appropriately.   CBC in AM     Status: Abnormal   Collection Time: 02/19/19  6:11 AM  Result Value Ref Range   WBC 5.5 4.0 - 10.5 K/uL   RBC 4.89 3.87 - 5.11 MIL/uL   Hemoglobin 9.7 (L) 12.0 - 15.0 g/dL   HCT 34.4 (L) 36.0 - 46.0 %   MCV 70.3 (L) 80.0 - 100.0 fL   MCH 19.8 (L) 26.0 - 34.0 pg   MCHC 28.2 (L) 30.0 - 36.0 g/dL   RDW 26.6 (H) 11.5 - 15.5 %   Platelets 378 150 - 400 K/uL   nRBC 0.0 0.0 - 0.2 %    Comment: Performed at [facility], [place] [address], [place], [place] [address]  BMP in AM     Status: None   Collection Time: 02/19/19  6:11 AM  Result Value Ref Range   Sodium 136 135 - 145 mmol/L   Potassium 3.8 3.5 - 5.1 mmol/L   Chloride 105 98 - 111 mmol/L   CO2 22 22 - 32 mmol/L   Glucose, Bld 83 70 - 99 mg/dL   BUN 8 6 - 20 mg/dL   Creatinine, Ser 0.79 0.44 - 1.00 mg/dL  Calcium 9.1 8.9 - 10.3 mg/dL   GFR calc non Af Amer >60 >60 mL/min   GFR calc Af Amer >60 >60 mL/min   Anion gap 9 5 - 15    Comment: Performed at [facility], [place] [address] [place], [place] [address]  Prealbumin     Status: None   Collection Time: 02/19/19  6:11 AM  Result Value Ref Range   Prealbumin 18.0 18 - 38 mg/dL    Comment: Performed at [facility], [place] [address], [place], [place] [address]  CEA     Status: Abnormal   Collection Time: 02/19/19  6:11 AM  Result Value Ref Range   CEA 8.0 (H) 0.0 -  4.7 ng/mL    Comment: (NOTE)                             Nonsmokers          <3.9                             Smokers             <5.6 [employer] Electrochemiluminescence Immunoassay (ECLIA) Values obtained with different assay methods or kits cannot be used interchangeably.  Results cannot be interpreted as absolute evidence of the presence or absence of malignant disease. Performed At: [facility] [place], [place] [id number] Rush Farmer MD [id number]     Studies/Results: No results found.  Medications: I have reviewed the patient's current medications.  Assessment: Sigmoid mass at 25 cm with inability to advance ultra slim colonoscope Biopsy showed tubulovillous adenoma with high-grade dysplasia, underlying carcinoma cannot be ruled out. Multiple polyps noted from above anal verge up to 25 cm from insertion  Plan: Patient's mother had colon cancer at age 51/40. Patient's maternal aunt also has history of colon cancer. Patient's twin brother required colectomy at [facility] in his 59s. Patient has 2 older siblings, both sisters, both in their 64s, 1 sister has been getting periodic colonoscopy which is normal as per her, while the other sister has not had a colonoscopy yet. 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 All suggestive of FAP. Discussed with Dr. Nadeen Landau from [facility], who will see the patient with possible plans for possible total proctocolectomy and ileostomy. Patient is aware of the work-up and verbalized understanding.  Ronnette Juniper, MD 02/20/2019, 11:04 AM

## 2019-02-20 NOTE — Progress Notes (Signed)
2 Days Post-Op    CC: Epigastric pain/anemia  Subjective: Patient is doing well this a.m. resting comfortably.  She said she discussed her options with her family yesterday.  They decided they want to go forward with the three-step procedure.  Objective: Vital signs in last 24 hours: Temp:  [98.2 F (36.8 C)-98.7 F (37.1 C)] 98.2 F (36.8 C) (11/18 0650) Pulse Rate:  [61-82] 75 (11/18 0650) Resp:  [19-20] 19 (11/18 0650) BP: (101-104)/(64-83) 104/64 (11/18 0650) SpO2:  [100 %] 100 % (11/18 0650) Last BM Date: 02/18/19 240 PO Voiding not recorded Afebrile vital signs are stable Labs are stable.  Hemoglobin up to 9.7, hematocrit 34.4. CEA is 8.0 Prealbumin is 18  Intake/Output from previous day: 11/17 0701 - 11/18 0700 In: 635 [P.O.:440; I.V.:18.1; IV Piggyback:176.9] Out: -  Intake/Output this shift: No intake/output data recorded.  General appearance: alert, cooperative and no distress Resp: clear to auscultation bilaterally GI: soft, non-tender; bowel sounds normal; no masses,  no organomegaly  Lab Results:  Recent Labs    02/19/19 0611  WBC 5.5  HGB 9.7*  HCT 34.4*  PLT 378    BMET Recent Labs    02/19/19 0611  NA 136  K 3.8  CL 105  CO2 22  GLUCOSE 83  BUN 8  CREATININE 0.79  CALCIUM 9.1   PT/INR No results for input(s): LABPROT, INR in the last 72 hours.  Recent Labs  Lab 02/14/19 1926 02/15/19 0407  AST 17 13*  ALT 15 13  ALKPHOS 35* 28*  BILITOT 0.7 0.8  PROT 7.6 6.3*  ALBUMIN 3.8 3.3*     Lipase     Component Value Date/Time   LIPASE 41 02/14/2019 1926     Medications: . feeding supplement  1 Container Oral TID BM  . 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  -CEA 8.0  FEN: Clear liquids ID:  None DVT:  SCD Follow up:  TBD  Plan: GI recommending testing for FAP.  They are recommending total proctocolectomy with ileostomy.  Pt had a long discussion with Dr. Dema Severin last PM and she is talking over options with her family.  It is Dr. Orest Dikes opinion we should go forward with genetic testing before putting this young woman thru a total proctocolectomy.    Despite colonoscopy finding she has been tolerating a diet before this and we can put her on a soft diet for now, low fiber. Continue with nutritional support.  Dr. Marcello Moores is on next week and will be available if there is an urgent need.  Patient is opting for the stage III procedure with a J-pouch as a final procedure.    LOS: 4 days    Jackie Littlejohn 02/20/2019 Please see Amion

## 2019-02-20 NOTE — Progress Notes (Signed)
PROGRESS NOTE    Briana Powers  [id number] DOB: 1990/07/17 DOA: 02/14/2019 PCP: Patient, No Pcp Per   Brief Narrative:  Patient is a 28 year old female with family history of FAP who presents to the emergency room with complaints of abdomen pain, weakness.  She was found to have hemoglobin of 6.4 with microcytic anemia.  Patient was transfused 2 units of PRBC with good response.  Patient was recently hospitalized at [facility] for microcytic anemia with Hemoccult positive stool, EGD and colonoscopy were not fully done  secondary to poor preparation but she was noted to have multiple polyps and there was concern for FAP.  She did not follow-up with them as she lost her insurance.  Patient evaluated by GI here and underwent colonoscopy on 11/16 which showed sigmoid mass, multiple polyps, highly suspicious for FAP.  Biopsy showed  tubulovillous adenoma with high grade dysplasia.General surgery following for possible plan for proctocolectomy.  Oncology also following.  Assessment & Plan:   Active Problems:   Symptomatic anemia   Symptomatic anemia: Secondary to chronic blood loss from colonic polyps.  Has microcytic anemia.  Given IV iron.  Presented with epigastric abdominal pain. She was transfused with 2 units of PRBC.  Currently H&H stable.  Colon polyps: Has family history of FAP syndrome.  Her mother and twin brother were diagnosed with FAP.  Her mother expired due to colon cancer.  Her brother already had colectomy. CT enterography showed chronic wall thickening at descending colon/sigmoid colon junction.  Underwent sigmoidoscopy with finding of obstructive sigmoid mass, unable to advance the scope beyond the mass.  Multiple polyps in the rectum and sigmoid.  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  General surgery following for possible plan for proctocolectomy and ileostomy. Oncology also following.    Right ovarian cyst: Currently stable.   Follow-up with GYN as an outpatient.   Nutrition Problem: Inadequate oral intake Etiology: altered GI function      DVT prophylaxis:SCD Code Status: Full Family Communication: None present at the bedside Disposition Plan: Home after full work-up/surgery   Consultants: GI, general surgery, oncology  Procedures: Sigmoidoscopy  Antimicrobials:  Anti-infectives (From admission, onward)   None      Subjective: Patient seen and examined the bedside this morning.  Hemodynamically stable.  Denies any nausea, vomiting or abdominal pain.  Comfortable  Objective: Vitals:   02/19/19 1241 02/19/19 2217 02/20/19 0650 02/20/19 0700  BP: 104/83 101/70 104/64   Pulse: 61 82 75   Resp: 20 20 19    Temp: 98.4 F (36.9 C) 98.7 F (37.1 C) 98.2 F (36.8 C)   TempSrc: Oral Oral Oral   SpO2: 100% 100% 100%   Weight:    53.3 kg  Height:        Intake/Output Summary (Last 24 hours) at 02/20/2019 1238 Last data filed at 02/20/2019 1100 Gross per 24 hour  Intake 634.98 ml  Output -  Net 634.98 ml   Filed Weights   02/15/19 1530 02/18/19 0523 02/20/19 0700  Weight: 52.5 kg 51.3 kg 53.3 kg    Examination:  General exam: Appears calm and comfortable ,Not in distress,average built HEENT:PERRL,Oral mucosa moist, Ear/Nose normal on gross exam Respiratory system: Bilateral equal air entry, normal vesicular breath sounds, no wheezes or crackles  Cardiovascular system: S1 & S2 heard, RRR. No JVD, murmurs, rubs, gallops or clicks. No pedal edema. Gastrointestinal system: Abdomen is nondistended, soft and nontender. No organomegaly or masses felt. Normal bowel sounds heard. Central nervous  system: Alert and oriented. No focal neurological deficits. Extremities: No edema, no clubbing ,no cyanosis, distal peripheral pulses palpable. Skin: No rashes, lesions or ulcers,no icterus ,no pallor   Data Reviewed: I have personally reviewed following labs and imaging studies  CBC: Recent Labs   Lab 02/14/19 1926  02/15/19 0407  02/16/19 0057 02/16/19 1121 02/16/19 1828 02/17/19 0517 02/19/19 0611  WBC 6.5  --  7.8  --   --  7.7  --  7.2 5.5  NEUTROABS 3.8  --   --   --   --   --   --   --   --   HGB 6.4*  --  7.2*   < > 8.9* 9.3* 9.5* 8.6* 9.7*  HCT 24.6*   < > 26.0*   < > 31.0* 34.0* 33.9* 30.5* 34.4*  MCV 64.1*  --  67.9*  --   --  70.7*  --  70.6* 70.3*  PLT 972*  --  762*  --   --  635*  --  549* 378   < > = values in this interval not displayed.   Basic Metabolic Panel: Recent Labs  Lab 02/14/19 1926 02/15/19 0407 02/16/19 1121 02/17/19 0517 02/19/19 0611  NA 138 137 139 137 136  K 3.7 4.0 3.6 3.8 3.8  CL 105 107 108 107 105  CO2 26 22 22 22 22   GLUCOSE 94 84 105* 95 83  BUN 14 12 8 10 8   CREATININE 0.99 0.86 0.74 0.75 0.79  CALCIUM 9.2 8.6* 8.7* 8.4* 9.1   GFR: Estimated Creatinine Clearance: 89.7 mL/min (by C-G formula based on SCr of 0.79 mg/dL). Liver Function Tests: Recent Labs  Lab 02/14/19 1926 02/15/19 0407  AST 17 13*  ALT 15 13  ALKPHOS 35* 28*  BILITOT 0.7 0.8  PROT 7.6 6.3*  ALBUMIN 3.8 3.3*   Recent Labs  Lab 02/14/19 1926  LIPASE 41   No results for input(s): AMMONIA in the last 168 hours. Coagulation Profile: Recent Labs  Lab 02/15/19 0407  INR 1.1   Cardiac Enzymes: No results for input(s): CKTOTAL, CKMB, CKMBINDEX, TROPONINI in the last 168 hours. BNP (last 3 results) No results for input(s): PROBNP in the last 8760 hours. HbA1C: No results for input(s): HGBA1C in the last 72 hours. CBG: No results for input(s): GLUCAP in the last 168 hours. Lipid Profile: No results for input(s): CHOL, HDL, LDLCALC, TRIG, CHOLHDL, LDLDIRECT in the last 72 hours. Thyroid Function Tests: No results for input(s): TSH, T4TOTAL, FREET4, T3FREE, THYROIDAB in the last 72 hours. Anemia Panel: No results for input(s): VITAMINB12, FOLATE, FERRITIN, TIBC, IRON, RETICCTPCT in the last 72 hours. Sepsis Labs: No results for input(s):  PROCALCITON, LATICACIDVEN in the last 168 hours.  Recent Results (from the past 240 hour(s))  Wet prep, genital     Status: Abnormal   Collection Time: 02/14/19  8:11 PM   Specimen: Cervix  Result Value Ref Range Status   Yeast Wet Prep HPF POC NONE SEEN NONE SEEN Final   Trich, Wet Prep NONE SEEN NONE SEEN Final   Clue Cells Wet Prep HPF POC PRESENT (A) NONE SEEN Final   WBC, Wet Prep HPF POC MANY (A) NONE SEEN Final   Sperm NONE SEEN  Final    Comment: Performed at [facility], [address] [place], [place] [address]  SARS CORONAVIRUS 2 (TAT 6-24 HRS) Nasopharyngeal Nasopharyngeal Swab     Status: None   Collection Time: 02/14/19  9:15 PM   Specimen: Nasopharyngeal Swab  Result Value Ref Range Status   SARS Coronavirus 2 NEGATIVE NEGATIVE Final    Comment: (NOTE) SARS-CoV-2 target nucleic acids are NOT DETECTED. The SARS-CoV-2 RNA is generally detectable in upper and lower respiratory specimens during the acute phase of infection. Negative results do not preclude SARS-CoV-2 infection, do not rule out co-infections with other pathogens, and should not be used as the sole basis for treatment or other patient management decisions. Negative results must be combined with clinical observations, patient history, and epidemiological information. The expected result is Negative. Fact Sheet for Patients: SugarRoll.be Fact Sheet for Healthcare Providers: https://www.woods-mathews.com/ This test is not yet approved or cleared by the [place] FDA and  has been authorized for detection and/or diagnosis of SARS-CoV-2 by FDA under an Emergency Use Authorization (EUA). This EUA will remain  in effect (meaning this test can be used) for the duration of the COVID-19 declaration under Section 56 4(b)(1) of the Act, 21 U.S.C. section 360bbb-3(b)(1), unless the authorization is terminated or revoked sooner. Performed at [facility], [place] [address], [place], [place] [address]   MRSA PCR Screening     Status: None   Collection Time: 02/14/19 11:27 PM   Specimen: Nasal Mucosa; Nasopharyngeal  Result Value Ref Range Status   MRSA by PCR NEGATIVE NEGATIVE Final    Comment:        The GeneXpert MRSA Assay (FDA approved for NASAL specimens only), is one component of a comprehensive MRSA colonization surveillance program. It is not intended to diagnose MRSA infection nor to guide or monitor treatment for MRSA infections. Performed at [facility], [place] [address] [place], [place] [address]          Radiology Studies: No results found.      Scheduled Meds: . feeding supplement  1 Container Oral TID BM  . feeding supplement (PRO-STAT SUGAR FREE 64)  30 mL Oral BID  . polyethylene glycol  17 g Oral Daily   Continuous Infusions: . sodium chloride Stopped (02/19/19 1652)  . lactated ringers Stopped (02/18/19 1300)     LOS: 4 days    Time spent: 25 mins.More than 50% of that time was spent in counseling and/or coordination of care.      Shelly Coss, MD [facility] Pager [phone]  If 7PM-7AM, please contact night-coverage [email or web address] Password [facility] 02/20/2019, 12:38 PM

## 2019-02-21 ENCOUNTER — Other Ambulatory Visit: Payer: Self-pay | Admitting: *Deleted

## 2019-02-21 ENCOUNTER — Encounter (HOSPITAL_COMMUNITY): Payer: Self-pay | Admitting: Gastroenterology

## 2019-02-21 DIAGNOSIS — R635 Abnormal weight gain: Secondary | ICD-10-CM

## 2019-02-21 DIAGNOSIS — R970 Elevated carcinoembryonic antigen [CEA]: Secondary | ICD-10-CM

## 2019-02-21 DIAGNOSIS — C189 Malignant neoplasm of colon, unspecified: Secondary | ICD-10-CM

## 2019-02-21 DIAGNOSIS — D649 Anemia, unspecified: Secondary | ICD-10-CM

## 2019-02-21 NOTE — Progress Notes (Signed)
PROGRESS NOTE    Briana Powers  [id number] DOB: [birth year] DOA: 02/14/2019 PCP: Patient, No Pcp Per   Brief Narrative:  Patient is a 28 year old female with family history of FAP who presents to the emergency room with complaints of abdomen pain, weakness.  She was found to have hemoglobin of 6.4 with microcytic anemia.  Patient was transfused 2 units of PRBC with good response.  Patient was recently hospitalized at [facility] for microcytic anemia with Hemoccult positive stool, EGD and colonoscopy were not fully done  secondary to poor preparation but she was noted to have multiple polyps and there was concern for FAP.  She did not follow-up with them as she lost her insurance.  Patient evaluated by GI here and underwent colonoscopy on 11/16 which showed sigmoid mass, multiple polyps, highly suspicious for FAP.  Biopsy showed  tubulovillous adenoma with high grade dysplasia.General surgery following for plan for proctocolectomy with ileostomy next week.  Oncology also following.  Assessment & Plan:   Active Problems:   Symptomatic anemia   Symptomatic anemia: Secondary to chronic blood loss from colonic polyps.  Has microcytic anemia.  Given IV iron.  Presented with epigastric abdominal pain. She was transfused with 2 units of PRBC.  Currently H&H stable.  Colon polyps: Has family history of FAP syndrome.  Her mother and twin brother were diagnosed with FAP.  Her mother expired due to colon cancer.  Her brother already had colectomy. CT enterography showed chronic wall thickening at descending colon/sigmoid colon junction.  Underwent sigmoidoscopy with finding of obstructive sigmoid mass, unable to advance the scope beyond the mass.  Multiple polyps in the rectum and sigmoid.  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  General surgery following for  plan for proctocolectomy and ileostomy next week. Oncology also following.   GI and general surgery  recommending genetic testing.  I had discussed with Dr. Marin Olp about this and he does not think that patient needs genetic testing at this point.  I have requested general  surgery to discuss with Dr. Marin Olp.  Right ovarian cyst: Currently stable.  Follow-up with GYN as an outpatient.   Nutrition Problem: Inadequate oral intake Etiology: altered GI function      DVT prophylaxis:SCD Code Status: Full Family Communication: None present at the bedside Disposition Plan: Home after full work-up/surgery   Consultants: GI, general surgery, oncology  Procedures: Sigmoidoscopy  Antimicrobials:  Anti-infectives (From admission, onward)   None      Subjective: Patient seen and examined the bedside this morning.  Currently comfortable.  Hemodynamically stable.  No active bleeding.  Had a bowel movement yesterday.  Tolerating diet.  Her diet has been advanced. Objective: Vitals:   02/20/19 1322 02/20/19 2142 02/21/19 0633 02/21/19 1216  BP: 107/67 117/70 102/73 103/62  Pulse: 75 67 70 74  Resp: 16 18 19 20   Temp: 98.8 F (37.1 C) 98.2 F (36.8 C) 98.2 F (36.8 C) 98.5 F (36.9 C)  TempSrc: Oral Oral Oral Oral  SpO2: 100% 100% 100% 100%  Weight:      Height:        Intake/Output Summary (Last 24 hours) at 02/21/2019 1436 Last data filed at 02/21/2019 1000 Gross per 24 hour  Intake 240 ml  Output -  Net 240 ml   Filed Weights   02/15/19 1530 02/18/19 0523 02/20/19 0700  Weight: 52.5 kg 51.3 kg 53.3 kg    Examination:  General exam: Appears calm and comfortable ,Not in distress,average built  HEENT:PERRL,Oral mucosa moist, Ear/Nose normal on gross exam Respiratory system: Bilateral equal air entry, normal vesicular breath sounds, no wheezes or crackles  Cardiovascular system: S1 & S2 heard, RRR. No JVD, murmurs, rubs, gallops or clicks. No pedal edema. Gastrointestinal system: Abdomen is nondistended, soft and nontender. No organomegaly or masses felt. Normal bowel  sounds heard. Central nervous system: Alert and oriented. No focal neurological deficits. Extremities: No edema, no clubbing ,no cyanosis, distal peripheral pulses palpable. Skin: No rashes, lesions or ulcers,no icterus ,no pallor   Data Reviewed: I have personally reviewed following labs and imaging studies  CBC: Recent Labs  Lab 02/14/19 1926  02/15/19 0407  02/16/19 0057 02/16/19 1121 02/16/19 1828 02/17/19 0517 02/19/19 0611  WBC 6.5  --  7.8  --   --  7.7  --  7.2 5.5  NEUTROABS 3.8  --   --   --   --   --   --   --   --   HGB 6.4*  --  7.2*   < > 8.9* 9.3* 9.5* 8.6* 9.7*  HCT 24.6*   < > 26.0*   < > 31.0* 34.0* 33.9* 30.5* 34.4*  MCV 64.1*  --  67.9*  --   --  70.7*  --  70.6* 70.3*  PLT 972*  --  762*  --   --  635*  --  549* 378   < > = values in this interval not displayed.   Basic Metabolic Panel: Recent Labs  Lab 02/14/19 1926 02/15/19 0407 02/16/19 1121 02/17/19 0517 02/19/19 0611  NA 138 137 139 137 136  K 3.7 4.0 3.6 3.8 3.8  CL 105 107 108 107 105  CO2 26 22 22 22 22   GLUCOSE 94 84 105* 95 83  BUN 14 12 8 10 8   CREATININE 0.99 0.86 0.74 0.75 0.79  CALCIUM 9.2 8.6* 8.7* 8.4* 9.1   GFR: Estimated Creatinine Clearance: 89.7 mL/min (by C-G formula based on SCr of 0.79 mg/dL). Liver Function Tests: Recent Labs  Lab 02/14/19 1926 02/15/19 0407  AST 17 13*  ALT 15 13  ALKPHOS 35* 28*  BILITOT 0.7 0.8  PROT 7.6 6.3*  ALBUMIN 3.8 3.3*   Recent Labs  Lab 02/14/19 1926  LIPASE 41   No results for input(s): AMMONIA in the last 168 hours. Coagulation Profile: Recent Labs  Lab 02/15/19 0407  INR 1.1   Cardiac Enzymes: No results for input(s): CKTOTAL, CKMB, CKMBINDEX, TROPONINI in the last 168 hours. BNP (last 3 results) No results for input(s): PROBNP in the last 8760 hours. HbA1C: No results for input(s): HGBA1C in the last 72 hours. CBG: No results for input(s): GLUCAP in the last 168 hours. Lipid Profile: No results for input(s):  CHOL, HDL, LDLCALC, TRIG, CHOLHDL, LDLDIRECT in the last 72 hours. Thyroid Function Tests: No results for input(s): TSH, T4TOTAL, FREET4, T3FREE, THYROIDAB in the last 72 hours. Anemia Panel: No results for input(s): VITAMINB12, FOLATE, FERRITIN, TIBC, IRON, RETICCTPCT in the last 72 hours. Sepsis Labs: No results for input(s): PROCALCITON, LATICACIDVEN in the last 168 hours.  Recent Results (from the past 240 hour(s))  Wet prep, genital     Status: Abnormal   Collection Time: 02/14/19  8:11 PM   Specimen: Cervix  Result Value Ref Range Status   Yeast Wet Prep HPF POC NONE SEEN NONE SEEN Final   Trich, Wet Prep NONE SEEN NONE SEEN Final   Clue Cells Wet Prep HPF POC PRESENT (A) NONE SEEN  Final   WBC, Wet Prep HPF POC MANY (A) NONE SEEN Final   Sperm NONE SEEN  Final    Comment: Performed at [facility], [address] [place], [place] [address]  SARS CORONAVIRUS 2 (TAT 6-24 HRS) Nasopharyngeal Nasopharyngeal Swab     Status: None   Collection Time: 02/14/19  9:15 PM   Specimen: Nasopharyngeal Swab  Result Value Ref Range Status   SARS Coronavirus 2 NEGATIVE NEGATIVE Final    Comment: (NOTE) SARS-CoV-2 target nucleic acids are NOT DETECTED. The SARS-CoV-2 RNA is generally detectable in upper and lower respiratory specimens during the acute phase of infection. Negative results do not preclude SARS-CoV-2 infection, do not rule out co-infections with other pathogens, and should not be used as the sole basis for treatment or other patient management decisions. Negative results must be combined with clinical observations, patient history, and epidemiological information. The expected result is Negative. Fact Sheet for Patients: SugarRoll.be Fact Sheet for Healthcare Providers: https://www.woods-mathews.com/ This test is not yet approved or cleared by the [place] FDA and  has been authorized for detection and/or diagnosis of  SARS-CoV-2 by FDA under an Emergency Use Authorization (EUA). This EUA will remain  in effect (meaning this test can be used) for the duration of the COVID-19 declaration under Section 56 4(b)(1) of the Act, 21 U.S.C. section 360bbb-3(b)(1), unless the authorization is terminated or revoked sooner. Performed at [facility], [place] [address] [place], [place] [address]   MRSA PCR Screening     Status: None   Collection Time: 02/14/19 11:27 PM   Specimen: Nasal Mucosa; Nasopharyngeal  Result Value Ref Range Status   MRSA by PCR NEGATIVE NEGATIVE Final    Comment:        The GeneXpert MRSA Assay (FDA approved for NASAL specimens only), is one component of a comprehensive MRSA colonization surveillance program. It is not intended to diagnose MRSA infection nor to guide or monitor treatment for MRSA infections. Performed at [facility], [place] [address] [place], [place] [address]          Radiology Studies: No results found.      Scheduled Meds: . feeding supplement  1 Container Oral TID BM  . feeding supplement (PRO-STAT SUGAR FREE 64)  30 mL Oral BID  . polyethylene glycol  17 g Oral Daily   Continuous Infusions: . sodium chloride Stopped (02/19/19 1652)  . lactated ringers Stopped (02/18/19 1300)     LOS: 5 days    Time spent: 25 mins.More than 50% of that time was spent in counseling and/or coordination of care.      Shelly Coss, MD [facility] Pager [phone]  If 7PM-7AM, please contact night-coverage [email or web address] Password [email or web address] 02/21/2019, 2:36 PM

## 2019-02-21 NOTE — Progress Notes (Signed)
The pathology report from the colonoscopy shows a polyp that high-grade dysplasia.  It is certainly conceivable that there is malignancy deeper into the polyp.  Her CEA is 8.  This certainly could suggest that there is malignancy.  She did get iron.  She has had no blood work done for 2 days.  Surgery is contemplating a proctocolectomy.  I would think she would need 1 given the extensive nature of her polyposis.  Really not much for [place] to do right now.  This is all about surgery and seeing what the pathology shows with surgery.  Lattie Haw, MD  Darlyn Chamber 29:11

## 2019-02-22 ENCOUNTER — Encounter: Payer: Self-pay | Admitting: Genetic Counselor

## 2019-02-22 ENCOUNTER — Telehealth: Payer: Self-pay | Admitting: Hematology

## 2019-02-22 LAB — COMPREHENSIVE METABOLIC PANEL
ALT: 27 U/L (ref 0–44)
AST: 14 U/L — ABNORMAL LOW (ref 15–41)
Albumin: 3.9 g/dL (ref 3.5–5.0)
Alkaline Phosphatase: 41 U/L (ref 38–126)
Anion gap: 7 (ref 5–15)
BUN: 15 mg/dL (ref 6–20)
CO2: 23 mmol/L (ref 22–32)
Calcium: 9.5 mg/dL (ref 8.9–10.3)
Chloride: 105 mmol/L (ref 98–111)
Creatinine, Ser: 0.68 mg/dL (ref 0.44–1.00)
GFR calc Af Amer: >60 mL/min (ref >60)
GFR calc non Af Amer: >60 mL/min (ref >60)
Glucose, Bld: 82 mg/dL (ref 70–99)
Potassium: 3.9 mmol/L (ref 3.5–5.1)
Sodium: 135 mmol/L (ref 135–145)
Total Bilirubin: 1.3 mg/dL — ABNORMAL HIGH (ref 0.3–1.2)
Total Protein: 7.6 g/dL (ref 6.5–8.1)

## 2019-02-22 LAB — CBC WITH DIFFERENTIAL/PLATELET
Abs Immature Granulocytes: 0.01 10*3/uL (ref 0.00–0.07)
Basophils Absolute: 0 10*3/uL (ref 0.0–0.1)
Basophils Relative: 1 %
Eosinophils Absolute: 0.3 10*3/uL (ref 0.0–0.5)
Eosinophils Relative: 5 %
HCT: 38.8 % (ref 36.0–46.0)
Hemoglobin: 10.7 g/dL — ABNORMAL LOW (ref 12.0–15.0)
Immature Granulocytes: 0 %
Lymphocytes Relative: 33 %
Lymphs Abs: 1.9 10*3/uL (ref 0.7–4.0)
MCH: 19.6 pg — ABNORMAL LOW (ref 26.0–34.0)
MCHC: 27.6 g/dL — ABNORMAL LOW (ref 30.0–36.0)
MCV: 71.1 fL — ABNORMAL LOW (ref 80.0–100.0)
Monocytes Absolute: 0.5 10*3/uL (ref 0.1–1.0)
Monocytes Relative: 8 %
Neutro Abs: 3.2 10*3/uL (ref 1.7–7.7)
Neutrophils Relative %: 53 %
Platelets: 184 10*3/uL (ref 150–400)
RBC: 5.46 MIL/uL — ABNORMAL HIGH (ref 3.87–5.11)
RDW: 26.6 % — ABNORMAL HIGH (ref 11.5–15.5)
WBC: 6 10*3/uL (ref 4.0–10.5)
nRBC: 0 % (ref 0.0–0.2)

## 2019-02-22 NOTE — Telephone Encounter (Signed)
lmom to inform patient of genetics appt 11/23 at 11 am pper 11/20 staff message

## 2019-02-22 NOTE — Discharge Instructions (Signed)
Soft-Food Eating Plan A soft-food eating plan includes foods that are safe and easy to chew and swallow. Your health care provider or dietitian can help you find foods and flavors that fit into this plan. Follow this plan until your health care provider or dietitian says it is safe to start eating other foods and food textures. What are tips for following this plan? General guidelines   Take small bites of food, or cut food into pieces about  inch or smaller. Bite-sized pieces of food are easier to chew and swallow.  Eat moist foods. Avoid overly dry foods.  Avoid foods that: ? Are difficult to swallow, such as dry, chunky, crispy, or sticky foods. ? Are difficult to chew, such as hard, tough, or stringy foods. ? Contain nuts, seeds, or fruits.  Follow instructions from your dietitian about the types of liquids that are safe for you to swallow. You may be allowed to have: ? Thick liquids only. This includes only liquids that are thicker than honey. ? Thin and thick liquids. This includes all beverages and foods that become liquid at room temperature.  To make thick liquids: ? Purchase a commercial liquid thickening powder. These are available at grocery stores and pharmacies. ? Mix the thickener into liquids according to instructions on the label. ? Purchase ready-made thickened liquids. ? Thicken soup by pureeing, straining to remove chunks, and adding flour, potato flakes, or corn starch. ? Add commercial thickener to foods that become liquid at room temperature, such as milk shakes, yogurt, ice cream, gelatin, and sherbet.  Ask your health care provider whether you need to take a fiber supplement. Cooking  Cook meats so they stay tender and moist. Use methods like braising, stewing, or baking in liquid.  Cook vegetables and fruit until they are soft enough to be mashed with a fork.  Peel soft, fresh fruits such as peaches, nectarines, and melons.  When making soup, make sure  chunks of meat and vegetables are smaller than  inch.  Reheat leftover foods slowly so that a tough crust does not form. What foods are allowed? The items listed below may not be a complete list. Talk with your dietitian about what dietary choices are best for you. Grains Breads, muffins, pancakes, or waffles moistened with syrup, jelly, or butter. Dry cereals well-moistened with milk. Moist, cooked cereals. Well-cooked pasta and rice. Vegetables All soft-cooked vegetables. Shredded lettuce. Fruits All canned and cooked fruits. Soft, peeled fresh fruits. Strawberries. Dairy Milk. Cream. Yogurt. Cottage cheese. Soft cheese without the rind. Meats and other protein foods Tender, moist ground meat, poultry, or fish. Meat cooked in gravy or sauces. Eggs. Sweets and desserts Ice cream. Milk shakes. Sherbet. Pudding. Fats and oils Butter. Margarine. Olive, canola, sunflower, and grapeseed oil. Smooth salad dressing. Smooth cream cheese. Mayonnaise. Gravy. What foods are not allowed? The items listed bemay not be a complete list. Talk with your dietitian about what dietary choices are best for you. Grains Coarse or dry cereals, such as bran, granola, and shredded wheat. Tough or chewy crusty breads, such as [place] bread or baguettes. Breads with nuts, seeds, or fruit. Vegetables All raw vegetables. Cooked corn. Cooked vegetables that are tough or stringy. Tough, crisp, fried potatoes and potato skins. Fruits Fresh fruits with skins or seeds, or both, such as apples, pears, and grapes. Stringy, high-pulp fruits, such as papaya, pineapple, coconut, and mango. Fruit leather and all dried fruit. Dairy Yogurt with nuts or coconut. Meats and other protein foods Hard,  dry sausages. Dry meat, poultry, or fish. Meats with gristle. Fish with bones. Fried meat or fish. Lunch meat and hotdogs. Nuts and seeds. Chunky peanut butter or other nut butters. Sweets and desserts Cakes or cookies that are very  dry or chewy. Desserts with dried fruit, nuts, or coconut. Fried pastries. Very rich pastries. Fats and oils Cream cheese with fruit or nuts. Salad dressings with seeds or chunks. Summary  A soft-food eating plan includes foods that are safe and easy to swallow. Generally, the foods should be soft enough to be mashed with a fork.  Avoid foods that are dry, hard to chew, crunchy, sticky, stringy, or crispy.  Ask your health care provider whether you need to thicken your liquids and if you need to take a fiber supplement. This information is not intended to replace advice given to you by your health care provider. Make sure you discuss any questions you have with your health care provider. Document Released: 06/28/2007 Document Revised: 07/12/2018 Document Reviewed: 05/24/2016 Elsevier Patient Education  [place].   High-Protein Foods List Foods Recommended Food Amount Protein (grams) Ground sirloin 3 oz 24 Tofu, firm  cup 20 Tuna fish packed in water 3 oz 20 Pork tenderloin 3 oz 18.4 Chicken breast, boneless/skinless 3 oz 26 Cottage cheese, low fat  cup 13.4 Soy milk 1 cup (8 oz) 11 Soybeans  cup 11 Vegetable or soy patty 1 patty 11 Pumpkin seeds 1 oz 8.5 Milk: fat free, low fat, whole 1 cup (8 oz) 8 Peanut butter, smooth or creamy 2 Tbsp 8 Yogurt 6 oz 8 Egg substitute  cup 7.5 Cheese 1 slice (1 oz) 7 Kidney beans, canned  cup 7 Nuts: peanuts, pistachios, almonds 1 oz 6 Fish (haddock, flounder, perch, pollock) 1 oz 6.5 Egg, whole or hard-boiled 1 egg 6 Sunflower seeds 1 oz 5.5

## 2019-02-22 NOTE — Progress Notes (Signed)
Briana Powers is doing okay.  Does not sound like there will be any surgery this week.  Hopefully, she will have surgery next week.  Surgery wants have her undergo genetic testing.  I am not sure if this is really done as an inpatient.  We will talk to our genetic counselor and see what she can do for her.  She did well with the iron.  She has had no lab work for 3 days.  We will have to see what her lab work looks like today.  Her CEA level was 8.  This might indicate that she does have a malignancy.  As far as any type of adjuvant therapy, this will really be dictated by the pathology at the time of surgery.  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  She seems to be eating a little bit better.  She does seem to be a little bit more perky.  At this point time, not much for [place] to do outside of watch her anemia.  She got the IV iron.  She got a good dose of iron through the IV infusion.  If I will take a couple weeks before that starts to kick in.  Lattie Haw, MD  Darlyn Chamber 33:3

## 2019-02-22 NOTE — Progress Notes (Signed)
4 Days Post-Op    CC: Epigastric pain/anemia  Subjective: Tolerating soft diet well so far she has not eaten very much at this point.  She did have a bowel movement last evening.  She has no abdominal discomfort.  Objective: Vital signs in last 24 hours: Temp:  [98 F (36.7 C)-98.8 F (37.1 C)] 98 F (36.7 C) (11/20 0538) Pulse Rate:  [66-74] 66 (11/20 0538) Resp:  [16-20] 16 (11/20 0538) BP: (103-110)/(62-70) 109/62 (11/20 0538) SpO2:  [100 %] 100 % (11/20 0538) Weight:  [53.3 kg] 53.3 kg (11/20 0500) Last BM Date: 02/21/19 Voided x2 BM x1 480 p.o. Afebrile, vital signs are stable Electrolytes are normal WBC 6.0, H/H 10.7/38.8, platelets 180 4K  Intake/Output from previous day: 11/19 0701 - 11/20 0700 In: 480 [P.O.:480] Out: -  Intake/Output this shift: No intake/output data recorded.  General appearance: alert, cooperative and no distress GI: soft, non-tender; bowel sounds normal; no masses,  no organomegaly  Lab Results:  Recent Labs    02/22/19 0903  WBC 6.0  HGB 10.7*  HCT 38.8  PLT 184    BMET Recent Labs    02/22/19 0903  NA 135  K 3.9  CL 105  CO2 23  GLUCOSE 82  BUN 15  CREATININE 0.68  CALCIUM 9.5   PT/INR No results for input(s): LABPROT, INR in the last 72 hours.  Recent Labs  Lab 02/22/19 0903  AST 14*  ALT 27  ALKPHOS 41  BILITOT 1.3*  PROT 7.6  ALBUMIN 3.9     Lipase     Component Value Date/Time   LIPASE 41 02/14/2019 1926     Medications: . feeding supplement  1 Container Oral TID BM  . feeding supplement (PRO-STAT SUGAR FREE 64)  30 mL Oral BID  . 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  -CEA 8.0  FEN: soft ID: None DVT: SCD Follow up: I have her an  appointment with Dr. Dema Severin next Tuesday 02/26/19 @ 9:15AM.   Plan:  I discussed this with Dr. Dema Severin.  He said if she does well with a soft diet she can go home and we will see her in the office.  If she does well with soft diet and continues to have BM's she could go home tomorrow.  If there is any issues she can stay and Dr. Marcello Moores is on next week and can assist with any urgent issues.      LOS: 6 days    Briana Powers 02/22/2019 Please see Amion

## 2019-02-22 NOTE — Progress Notes (Signed)
NUTRITION NOTE RD working remotely.   Patient being followed by RD and was last assessed by another RD on 11/17. At that time, patient was on CLD. She has now been advanced to Soft diet. Plan is for proctocolectomy and ileostomy. Consult received from Surgery PA stating possible d/c and request to aid patient in increasing protein intake after discharge. Will place high protein foods list handouts in Discharge Instructions.   Last recorded intakes were 100% of breakfast on 11/18 (CLD) and 50% of breakfast on 11/19 (Soft diet). Orders in place for Boost Breeze TID, each provides 350 kcal and 9 grams protein (accepting 100%) and 30 ml prostat BID, each provides 100 kcal and 15 grams protein (accepting 100%).    Jarome Matin, MS, RD, LDN, [facility] Inpatient Clinical Dietitian Pager # [phone] After hours/weekend pager # [phone]

## 2019-02-22 NOTE — Progress Notes (Signed)
PROGRESS NOTE    Briana Powers  [id number] DOB: 03-08-1991 DOA: 02/14/2019 PCP: Patient, No Pcp Per   Brief Narrative:  Patient is a 28 year old female with family history of FAP who presents to the emergency room with complaints of abdomen pain, weakness.  She was found to have hemoglobin of 6.4 with microcytic anemia.  Patient was transfused 2 units of PRBC with good response.  Patient was recently hospitalized at [facility] for microcytic anemia with Hemoccult positive stool, EGD and colonoscopy were not fully done  secondary to poor preparation but she was noted to have multiple polyps and there was concern for FAP.  She did not follow-up with them as she lost her insurance.  Patient evaluated by GI here and underwent colonoscopy on 11/16 which showed sigmoid mass, multiple polyps, highly suspicious for FAP.  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  Oncology also following. Started on soft diet. Plan for DC home tomorrow  Assessment & Plan:   Active Problems:   Symptomatic anemia   Symptomatic anemia: Secondary to chronic blood loss from colonic polyps.  Has microcytic anemia.  Given IV iron.  Presented with epigastric abdominal pain. She was transfused with 2 units of PRBC.  Currently H&H stable.  Colon polyps: Has family history of FAP syndrome.  Her mother and twin brother were diagnosed with FAP.  Her mother expired due to colon cancer.  Her brother already had colectomy. CT enterography showed chronic wall thickening at descending colon/sigmoid colon junction.  Underwent sigmoidoscopy with finding of obstructive sigmoid mass, unable to advance the scope beyond the mass.  Multiple polyps in the rectum and sigmoid.  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  General surgery following for  plan for proctocolectomy and ileostomy. Oncology also following.    GI and general surgery recommending genetic testing.  Oncology aware about this. She has an appointment with Dr. Dema Severin next Tuesday 02/26/19 @ 9:15AM  Right ovarian cyst: Currently stable.  Follow-up with GYN as an outpatient.   Nutrition Problem: Inadequate oral intake Etiology: altered GI function      DVT prophylaxis:SCD Code Status: Full Family Communication: None present at the bedside Disposition Plan: Home tomorrow if she tolerates soft diet   Consultants: GI, general surgery, oncology  Procedures: Sigmoidoscopy  Antimicrobials:  Anti-infectives (From admission, onward)   None      Subjective: Patient seen and examined the bedside this morning.  Hemodynamically stable.  Tolerating diet and having bowel movements.  Objective: Vitals:   02/21/19 1216 02/21/19 2129 02/22/19 0500 02/22/19 0538  BP: 103/62 110/70  109/62  Pulse: 74 67  66  Resp: 20 18  16   Temp: 98.5 F (36.9 C) 98.8 F (37.1 C)  98 F (36.7 C)  TempSrc: Oral Oral  Oral  SpO2: 100% 100%  100%  Weight:   53.3 kg   Height:        Intake/Output Summary (Last 24 hours) at 02/22/2019 1152 Last data filed at 02/21/2019 2136 Gross per 24 hour  Intake 240 ml  Output -  Net 240 ml   Filed Weights   02/18/19 0523 02/20/19 0700 02/22/19 0500  Weight: 51.3 kg 53.3 kg 53.3 kg    Examination:  General exam: Appears calm and comfortable ,Not in distress,average built HEENT:PERRL,Oral mucosa moist, Ear/Nose normal on gross exam Respiratory system: Bilateral equal air entry, normal vesicular breath sounds, no wheezes or crackles  Cardiovascular  system: S1 & S2 heard, RRR. No JVD, murmurs, rubs, gallops or clicks. No pedal edema. Gastrointestinal system: Abdomen is nondistended, soft and nontender. No organomegaly or masses felt. Normal bowel sounds heard. Central nervous system: Alert and oriented. No focal neurological deficits. Extremities: No edema, no clubbing ,no cyanosis, distal peripheral  pulses palpable. Skin: No rashes, lesions or ulcers,no icterus ,no pallor   Data Reviewed: I have personally reviewed following labs and imaging studies  CBC: Recent Labs  Lab 02/16/19 1121 02/16/19 1828 02/17/19 0517 02/19/19 0611 02/22/19 0903  WBC 7.7  --  7.2 5.5 6.0  NEUTROABS  --   --   --   --  3.2  HGB 9.3* 9.5* 8.6* 9.7* 10.7*  HCT 34.0* 33.9* 30.5* 34.4* 38.8  MCV 70.7*  --  70.6* 70.3* 71.1*  PLT 635*  --  549* 378 [id number]   Basic Metabolic Panel: Recent Labs  Lab 02/16/19 1121 02/17/19 0517 02/19/19 0611 02/22/19 0903  NA 139 137 136 135  K 3.6 3.8 3.8 3.9  CL 108 107 105 105  CO2 22 22 22 23   GLUCOSE 105* 95 83 82  BUN 8 10 8 15   CREATININE 0.74 0.75 0.79 0.68  CALCIUM 8.7* 8.4* 9.1 9.5   GFR: Estimated Creatinine Clearance: 89.7 mL/min (by C-G formula based on SCr of 0.68 mg/dL). Liver Function Tests: Recent Labs  Lab 02/22/19 0903  AST 14*  ALT 27  ALKPHOS 41  BILITOT 1.3*  PROT 7.6  ALBUMIN 3.9   No results for input(s): LIPASE, AMYLASE in the last 168 hours. No results for input(s): AMMONIA in the last 168 hours. Coagulation Profile: No results for input(s): INR, PROTIME in the last 168 hours. Cardiac Enzymes: No results for input(s): CKTOTAL, CKMB, CKMBINDEX, TROPONINI in the last 168 hours. BNP (last 3 results) No results for input(s): PROBNP in the last 8760 hours. HbA1C: No results for input(s): HGBA1C in the last 72 hours. CBG: No results for input(s): GLUCAP in the last 168 hours. Lipid Profile: No results for input(s): CHOL, HDL, LDLCALC, TRIG, CHOLHDL, LDLDIRECT in the last 72 hours. Thyroid Function Tests: No results for input(s): TSH, T4TOTAL, FREET4, T3FREE, THYROIDAB in the last 72 hours. Anemia Panel: No results for input(s): VITAMINB12, FOLATE, FERRITIN, TIBC, IRON, RETICCTPCT in the last 72 hours. Sepsis Labs: No results for input(s): PROCALCITON, LATICACIDVEN in the last 168 hours.  Recent Results (from the past 240  hour(s))  Wet prep, genital     Status: Abnormal   Collection Time: 02/14/19  8:11 PM   Specimen: Cervix  Result Value Ref Range Status   Yeast Wet Prep HPF POC NONE SEEN NONE SEEN Final   Trich, Wet Prep NONE SEEN NONE SEEN Final   Clue Cells Wet Prep HPF POC PRESENT (A) NONE SEEN Final   WBC, Wet Prep HPF POC MANY (A) NONE SEEN Final   Sperm NONE SEEN  Final    Comment: Performed at [facility], [address] [place], [place] [address]  SARS CORONAVIRUS 2 (TAT 6-24 HRS) Nasopharyngeal Nasopharyngeal Swab     Status: None   Collection Time: 02/14/19  9:15 PM   Specimen: Nasopharyngeal Swab  Result Value Ref Range Status   SARS Coronavirus 2 NEGATIVE NEGATIVE Final    Comment: (NOTE) SARS-CoV-2 target nucleic acids are NOT DETECTED. The SARS-CoV-2 RNA is generally detectable in upper and lower respiratory specimens during the acute phase of infection. Negative results do not preclude SARS-CoV-2 infection, do not  rule out co-infections with other pathogens, and should not be used as the sole basis for treatment or other patient management decisions. Negative results must be combined with clinical observations, patient history, and epidemiological information. The expected result is Negative. Fact Sheet for Patients: SugarRoll.be Fact Sheet for Healthcare Providers: https://www.woods-mathews.com/ This test is not yet approved or cleared by the [place] FDA and  has been authorized for detection and/or diagnosis of SARS-CoV-2 by FDA under an Emergency Use Authorization (EUA). This EUA will remain  in effect (meaning this test can be used) for the duration of the COVID-19 declaration under Section 56 4(b)(1) of the Act, 21 U.S.C. section 360bbb-3(b)(1), unless the authorization is terminated or revoked sooner. Performed at [facility], [place] [address] [place], [place] [address]   MRSA PCR Screening     Status: None    Collection Time: 02/14/19 11:27 PM   Specimen: Nasal Mucosa; Nasopharyngeal  Result Value Ref Range Status   MRSA by PCR NEGATIVE NEGATIVE Final    Comment:        The GeneXpert MRSA Assay (FDA approved for NASAL specimens only), is one component of a comprehensive MRSA colonization surveillance program. It is not intended to diagnose MRSA infection nor to guide or monitor treatment for MRSA infections. Performed at [facility], [place] [address], [place],  [address]          Radiology Studies: No results found.      Scheduled Meds: . feeding supplement  1 Container Oral TID BM  . feeding supplement (PRO-STAT SUGAR FREE 64)  30 mL Oral BID  . polyethylene glycol  17 g Oral Daily   Continuous Infusions: . sodium chloride Stopped (02/19/19 1652)  . lactated ringers Stopped (02/18/19 1300)     LOS: 6 days    Time spent: 25 mins.More than 50% of that time was spent in counseling and/or coordination of care.      Shelly Coss, MD [facility] Pager [phone]  If 7PM-7AM, please contact night-coverage [email or web address] Password [facility] 02/22/2019, 11:52 AM

## 2019-02-23 MED ORDER — POLYETHYLENE GLYCOL 3350 17 G PO PACK: 17.0000 g | PACK | Freq: Every day | ORAL | 0 refills | Status: AC

## 2019-02-23 NOTE — Progress Notes (Addendum)
Reviewed discharge paperwork with patient, medication regimen, and follow up appointments. Provided letter from doctor from doctor regarding dates of hospital stay. Patient walked to main entrance herself.

## 2019-02-23 NOTE — Discharge Summary (Signed)
Physician Discharge Summary  Alicyn Suon [id number] DOB: [birth year] DOA: 02/14/2019  PCP: Patient, No Pcp Per  Admit date: 02/14/2019 Discharge date: 02/23/2019  Admitted From: Home Disposition:  Home  Discharge Condition:Stable CODE STATUS:FULL Diet recommendation:Regular  Brief/Interim Summary:  Patient is a 28 year old female with family history of FAP who presents to the emergency room with complaints of abdomen pain, weakness.  She was found to have hemoglobin of 6.4 with microcytic anemia.  Patient was transfused 2 units of PRBC with good response.  Patient was recently hospitalized at [facility] for microcytic anemia with Hemoccult positive stool, EGD and colonoscopy were not fully done  secondary to poor preparation but she was noted to have multiple polyps and there was concern for FAP.  She did not follow-up with them as she lost her insurance.  Patient evaluated by GI here and underwent colonoscopy on 11/16 which showed sigmoid mass, multiple polyps, highly suspicious for FAP.  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  Oncology also following. She is tolerating diet and having bowel movement.  She has an appointment at the cancer center for genetic testing.  She has an appointment with surgery on November 24.  Following problems were addressed during hospitalization:  Symptomatic anemia: Secondary to chronic blood loss from colonic polyps.  Has microcytic anemia.  Given IV iron.  Presented with epigastric abdominal pain. She was transfused with 2 units of PRBC.  Currently H&H stable.  Colon polyps: Has family history of FAP syndrome.  Her mother and twin brother were diagnosed with FAP.  Her mother expired due to colon cancer.  Her brother already had colectomy. CT enterography showed chronic wall thickening at descending colon/sigmoid colon junction.  Underwent  sigmoidoscopy with finding of obstructive sigmoid mass, unable to advance the scope beyond the mass.  Multiple polyps in the rectum and sigmoid.  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  Elevated CEA level. General surgery following for  plan for proctocolectomy and ileostomy. Oncology also following.   GI and general surgery recommending genetic testing.  Oncology aware about this. She has an appointment with Dr. Dema Severin next Tuesday 02/26/19 @ 9:15AM   Right ovarian cyst: Currently stable.  Follow-up with GYN as an outpatient.    Discharge Diagnoses:  Active Problems:   Symptomatic anemia    Discharge Instructions  Discharge Instructions    Diet general   Complete by: As directed    Discharge instructions   Complete by: As directed    1)Follow up at cancer center for the genetic testing. 2)Follow up with Dr. Dema Severin on November 24, 9: 15 a.m..  Name and number of the provider has been attached.   Increase activity slowly   Complete by: As directed      Allergies as of 02/23/2019   No Known Allergies     Medication List    TAKE these medications   polyethylene glycol 17 g packet Commonly known as: MIRALAX / GLYCOLAX Take 17 g by mouth daily. Start taking on: February 24, 2019      Follow-up Information    Ileana Roup, MD Follow up on 02/26/2019.   Specialty: General Surgery Why: Your appointment is at 9:15 AM.  Be at the office 30 minutes early for check in.  Bring photo ID and insurance information.  Stay on your soft diet, with supplements to improve nutrition.   Contact information: [place]  [place] [address] [phone]          No Known Allergies  Consultations:  Oncology, GI, general surgery.   Procedures/Studies: Ct Entero Abd/pelvis W Contast  Result Date: 02/15/2019 CLINICAL DATA:  Inpatient. Epigastric abdominal pain and symptomatic anemia, with episodes of bright red blood per rectum for  several months. History of duodenal adenoma. EXAM: CT ABDOMEN AND PELVIS WITH CONTRAST (ENTEROGRAPHY) TECHNIQUE: Multidetector CT of the abdomen and pelvis during bolus administration of intravenous contrast. Negative oral contrast was given. CONTRAST:  162mL OMNIPAQUE IOHEXOL 300 MG/ML  SOLN COMPARISON:  None. FINDINGS: Lower chest: No significant pulmonary nodules or acute consolidative airspace disease. Hepatobiliary: Normal liver size. No liver mass. Normal gallbladder with no radiopaque cholelithiasis. No biliary ductal dilatation. Pancreas: Normal, with no mass or duct dilation. Spleen: Normal size. No mass. Adrenals/Urinary Tract: Normal adrenals. Normal kidneys with no hydronephrosis and no renal mass. Normal bladder. Stomach/Bowel: Normal non-distended stomach. Normal caliber small bowel. No small bowel wall thickening or mucosal hyperenhancement. No discrete small bowel mass. No pneumatosis. No appreciable bowel fistula. Appendectomy. Mobile cecum located in the right upper quadrant. There is segmental circumferential wall thickening at the junction of the descending and sigmoid colon in the left lower quadrant (series 4/image 141 and series 7/image 29). No additional sites of large bowel wall thickening. No significant colonic diverticulosis. Mild-to-moderate colonic stool. Vascular/Lymphatic: Normal caliber abdominal aorta. Borderline prominent 1.0 cm left inguinal node (series 4/image 197). No additional pathologically enlarged lymph nodes in the abdomen or pelvis. Reproductive: Grossly normal anteverted uterus. No left adnexal mass. There is a 4.3 x 4.3 cm right adnexal mass with mildly irregular peripheral wall thickening and enhancement and central low attenuation (series 4/image 162). Other: Small amount of simple free fluid in the pelvic cul-de-sac. No focal fluid collection. No pneumoperitoneum. Musculoskeletal: No aggressive appearing focal osseous lesions. IMPRESSION: 1. Segmental  circumferential wall thickening at the junction of the descending and sigmoid colon, indeterminate, differential includes inflammatory or neoplastic etiologies. GI consultation for colonoscopy correlation suggested. 2. No bowel obstruction.  No abscess.  No fistula.  No free air. 3. Indeterminate 4.3 cm right adnexal lesion with irregular peripheral wall thickening and central low attenuation. Small volume free fluid in the pelvic cul-de-sac. Findings may represent corpus luteal cyst, however ultrasound correlation advised to exclude other etiologies. Electronically Signed   By: Ilona Sorrel M.D.   On: 02/15/2019 15:24   US Pelvic Complete With Transvaginal  Result Date: 02/16/2019 CLINICAL DATA:  Evaluate right adnexal mass EXAM: TRANSABDOMINAL AND TRANSVAGINAL ULTRASOUND OF PELVIS DOPPLER ULTRASOUND OF OVARIES TECHNIQUE: Both transabdominal and transvaginal ultrasound examinations of the pelvis were performed. Transabdominal technique was performed for global imaging of the pelvis including uterus, ovaries, adnexal regions, and pelvic cul-de-sac. It was necessary to proceed with endovaginal exam following the transabdominal exam to visualize the ovaries and adnexa. Color and duplex Doppler ultrasound was utilized to evaluate blood flow to the ovaries. COMPARISON:  CT enterography abdomen and pelvis, 02/15/2019 FINDINGS: Uterus Measurements: 8.7 x 4.8 x 5.5 cm = volume: 119 mL. No fibroids or other mass visualized. Endometrium Thickness: 8 mm.  No focal abnormality visualized. Right ovary Measurements: 5.8 x 5.2 x 4.9 cm = volume: 79 mL. The right ovary is enlarged by a dominant cyst measuring 3.9 x 3.5 x 3.5 cm with multiple smaller cysts and follicles. Left ovary Measurements: 4.0 x 2.3 x 2.1 cm = volume: 10 mL. Multiple small follicles. Pulsed Doppler evaluation of both ovaries demonstrates normal low-resistance  arterial and venous waveforms. Other findings Trace free fluid in the pelvis. IMPRESSION: 1. The  right ovary is enlarged by a dominant cyst measuring 3.9 cm, in keeping with prior CT findings and almost certainly a benign functional cyst/follicular remnant in the reproductive age setting. Consider follow-up ultrasound at 6 weeks to assess for stability or resolution. 2. Arterial and venous Doppler flow are present to the ovaries bilaterally, however intermittent or incomplete ovarian torsion cannot be strictly excluded in any enlarged ovary if there is acutely referable pain. 3.  Trace, nonspecific free fluid in the pelvis. Electronically Signed   By: Eddie Candle M.D.   On: 02/16/2019 09:45       Subjective: Patient seen and examined the bedside this morning.  Hemodynamically stable for discharge  Discharge Exam: Vitals:   02/22/19 2149 02/23/19 0626  BP: 105/66 109/65  Pulse: 85 73  Resp: 19 19  Temp: (!) 97.4 F (36.3 C) 98 F (36.7 C)  SpO2: 100% 100%   Vitals:   02/22/19 0538 02/22/19 1526 02/22/19 2149 02/23/19 0626  BP: 109/62 110/71 105/66 109/65  Pulse: 66 78 85 73  Resp: 16 18 19 19   Temp: 98 F (36.7 C) 98.9 F (37.2 C) (!) 97.4 F (36.3 C) 98 F (36.7 C)  TempSrc: Oral Oral Oral Oral  SpO2: 100% 100% 100% 100%  Weight:      Height:        General: Pt is alert, awake, not in acute distress Cardiovascular: RRR, S1/S2 +, no rubs, no gallops Respiratory: CTA bilaterally, no wheezing, no rhonchi Abdominal: Soft, NT, ND, bowel sounds + Extremities: no edema, no cyanosis    The results of significant diagnostics from this hospitalization (including imaging, microbiology, ancillary and laboratory) are listed below for reference.     Microbiology: Recent Results (from the past 240 hour(s))  Wet prep, genital     Status: Abnormal   Collection Time: 02/14/19  8:11 PM   Specimen: Cervix  Result Value Ref Range Status   Yeast Wet Prep HPF POC NONE SEEN NONE SEEN Final   Trich, Wet Prep NONE SEEN NONE SEEN Final   Clue Cells Wet Prep HPF POC PRESENT (A) NONE  SEEN Final   WBC, Wet Prep HPF POC MANY (A) NONE SEEN Final   Sperm NONE SEEN  Final    Comment: Performed at [facility], [address] [place], [place] [address]  SARS CORONAVIRUS 2 (TAT 6-24 HRS) Nasopharyngeal Nasopharyngeal Swab     Status: None   Collection Time: 02/14/19  9:15 PM   Specimen: Nasopharyngeal Swab  Result Value Ref Range Status   SARS Coronavirus 2 NEGATIVE NEGATIVE Final    Comment: (NOTE) SARS-CoV-2 target nucleic acids are NOT DETECTED. The SARS-CoV-2 RNA is generally detectable in upper and lower respiratory specimens during the acute phase of infection. Negative results do not preclude SARS-CoV-2 infection, do not rule out co-infections with other pathogens, and should not be used as the sole basis for treatment or other patient management decisions. Negative results must be combined with clinical observations, patient history, and epidemiological information. The expected result is Negative. Fact Sheet for Patients: SugarRoll.be Fact Sheet for Healthcare Providers: https://www.woods-mathews.com/ This test is not yet approved or cleared by the [place] FDA and  has been authorized for detection and/or diagnosis of SARS-CoV-2 by FDA under an Emergency Use Authorization (EUA). This EUA will remain  in effect (meaning this test can be used) for the duration of  the COVID-19 declaration under Section 56 4(b)(1) of the Act, 21 U.S.C. section 360bbb-3(b)(1), unless the authorization is terminated or revoked sooner. Performed at [facility], [place] [address], [place], [place] [address]   MRSA PCR Screening     Status: None   Collection Time: 02/14/19 11:27 PM   Specimen: Nasal Mucosa; Nasopharyngeal  Result Value Ref Range Status   MRSA by PCR NEGATIVE NEGATIVE Final    Comment:        The GeneXpert MRSA Assay (FDA approved for NASAL specimens only), is one component of a comprehensive MRSA  colonization surveillance program. It is not intended to diagnose MRSA infection nor to guide or monitor treatment for MRSA infections. Performed at [facility], [place] [address] [place], [place] [address]      Labs: BNP (last 3 results) No results for input(s): BNP in the last 8760 hours. Basic Metabolic Panel: Recent Labs  Lab 02/17/19 0517 02/19/19 0611 02/22/19 0903  NA 137 136 135  K 3.8 3.8 3.9  CL 107 105 105  CO2 22 22 23   GLUCOSE 95 83 82  BUN 10 8 15   CREATININE 0.75 0.79 0.68  CALCIUM 8.4* 9.1 9.5   Liver Function Tests: Recent Labs  Lab 02/22/19 0903  AST 14*  ALT 27  ALKPHOS 41  BILITOT 1.3*  PROT 7.6  ALBUMIN 3.9   No results for input(s): LIPASE, AMYLASE in the last 168 hours. No results for input(s): AMMONIA in the last 168 hours. CBC: Recent Labs  Lab 02/16/19 1828 02/17/19 0517 02/19/19 0611 02/22/19 0903  WBC  --  7.2 5.5 6.0  NEUTROABS  --   --   --  3.2  HGB 9.5* 8.6* 9.7* 10.7*  HCT 33.9* 30.5* 34.4* 38.8  MCV  --  70.6* 70.3* 71.1*  PLT  --  549* 378 184   Cardiac Enzymes: No results for input(s): CKTOTAL, CKMB, CKMBINDEX, TROPONINI in the last 168 hours. BNP: Invalid input(s): POCBNP CBG: No results for input(s): GLUCAP in the last 168 hours. D-Dimer No results for input(s): DDIMER in the last 72 hours. Hgb A1c No results for input(s): HGBA1C in the last 72 hours. Lipid Profile No results for input(s): CHOL, HDL, LDLCALC, TRIG, CHOLHDL, LDLDIRECT in the last 72 hours. Thyroid function studies No results for input(s): TSH, T4TOTAL, T3FREE, THYROIDAB in the last 72 hours.  Invalid input(s): FREET3 Anemia work up No results for input(s): VITAMINB12, FOLATE, FERRITIN, TIBC, IRON, RETICCTPCT in the last 72 hours. Urinalysis    Component Value Date/Time   COLORURINE YELLOW 02/14/2019 1926   APPEARANCEUR CLOUDY (A) 02/14/2019 1926   LABSPEC 1.010 02/14/2019 1926   PHURINE 7.5 02/14/2019 1926   GLUCOSEU  NEGATIVE 02/14/2019 1926   HGBUR NEGATIVE 02/14/2019 1926   BILIRUBINUR NEGATIVE 02/14/2019 1926   KETONESUR NEGATIVE 02/14/2019 1926   PROTEINUR NEGATIVE 02/14/2019 1926   NITRITE NEGATIVE 02/14/2019 1926   LEUKOCYTESUR SMALL (A) 02/14/2019 1926   Sepsis Labs Invalid input(s): PROCALCITONIN,  WBC,  LACTICIDVEN Microbiology Recent Results (from the past 240 hour(s))  Wet prep, genital     Status: Abnormal   Collection Time: 02/14/19  8:11 PM   Specimen: Cervix  Result Value Ref Range Status   Yeast Wet Prep HPF POC NONE SEEN NONE SEEN Final   Trich, Wet Prep NONE SEEN NONE SEEN Final   Clue Cells Wet Prep HPF POC PRESENT (A) NONE SEEN Final   WBC, Wet Prep HPF POC MANY (A) NONE SEEN  Final   Sperm NONE SEEN  Final    Comment: Performed at [facility], [address] [place], [place] [address]  SARS CORONAVIRUS 2 (TAT 6-24 HRS) Nasopharyngeal Nasopharyngeal Swab     Status: None   Collection Time: 02/14/19  9:15 PM   Specimen: Nasopharyngeal Swab  Result Value Ref Range Status   SARS Coronavirus 2 NEGATIVE NEGATIVE Final    Comment: (NOTE) SARS-CoV-2 target nucleic acids are NOT DETECTED. The SARS-CoV-2 RNA is generally detectable in upper and lower respiratory specimens during the acute phase of infection. Negative results do not preclude SARS-CoV-2 infection, do not rule out co-infections with other pathogens, and should not be used as the sole basis for treatment or other patient management decisions. Negative results must be combined with clinical observations, patient history, and epidemiological information. The expected result is Negative. Fact Sheet for Patients: SugarRoll.be Fact Sheet for Healthcare Providers: https://www.woods-mathews.com/ This test is not yet approved or cleared by the [place] FDA and  has been authorized for detection and/or diagnosis of SARS-CoV-2 by FDA under an Emergency Use  Authorization (EUA). This EUA will remain  in effect (meaning this test can be used) for the duration of the COVID-19 declaration under Section 56 4(b)(1) of the Act, 21 U.S.C. section 360bbb-3(b)(1), unless the authorization is terminated or revoked sooner. Performed at [facility], [place] [address] [place], [place] [address]   MRSA PCR Screening     Status: None   Collection Time: 02/14/19 11:27 PM   Specimen: Nasal Mucosa; Nasopharyngeal  Result Value Ref Range Status   MRSA by PCR NEGATIVE NEGATIVE Final    Comment:        The GeneXpert MRSA Assay (FDA approved for NASAL specimens only), is one component of a comprehensive MRSA colonization surveillance program. It is not intended to diagnose MRSA infection nor to guide or monitor treatment for MRSA infections. Performed at [facility], [place] [address] [place],  [address]     Please note: You were cared for by a hospitalist during your hospital stay. Once you are discharged, your primary care physician will handle any further medical issues. Please note that NO REFILLS for any discharge medications will be authorized once you are discharged, as it is imperative that you return to your primary care physician (or establish a relationship with a primary care physician if you do not have one) for your post hospital discharge needs so that they can reassess your need for medications and monitor your lab values.    Time coordinating discharge: 40 minutes  SIGNED:   Shelly Coss, MD  [facility] 02/23/2019, 12:30 PM Pager [phone]  If 7PM-7AM, please contact night-coverage [email or web address] Password [email or web address]

## 2019-02-25 ENCOUNTER — Inpatient Hospital Stay: Payer: 59 | Attending: Genetic Counselor | Admitting: Genetic Counselor

## 2019-02-25 ENCOUNTER — Other Ambulatory Visit: Payer: Self-pay | Admitting: Genetic Counselor

## 2019-02-25 ENCOUNTER — Telehealth: Payer: Self-pay | Admitting: Genetic Counselor

## 2019-02-25 ENCOUNTER — Inpatient Hospital Stay: Payer: 59

## 2019-02-25 ENCOUNTER — Other Ambulatory Visit: Payer: Self-pay

## 2019-02-25 ENCOUNTER — Encounter: Payer: Self-pay | Admitting: Genetic Counselor

## 2019-02-25 DIAGNOSIS — Z800 Family history of malignant neoplasm of digestive organs: Secondary | ICD-10-CM | POA: Diagnosis not present

## 2019-02-25 DIAGNOSIS — K6389 Other specified diseases of intestine: Secondary | ICD-10-CM | POA: Diagnosis not present

## 2019-02-25 DIAGNOSIS — K635 Polyp of colon: Secondary | ICD-10-CM | POA: Diagnosis not present

## 2019-02-25 DIAGNOSIS — D649 Anemia, unspecified: Secondary | ICD-10-CM

## 2019-02-25 NOTE — Progress Notes (Signed)
REFERRING PROVIDER: Volanda Napoleon, MD [address] [place],  [place] [address]  PRIMARY PROVIDER:  Patient, No Pcp Per  PRIMARY REASON FOR VISIT:  1. Family history of colon cancer   2. Polyposis of colon   3. Mass of colon      HISTORY OF PRESENT ILLNESS:   Ms. Desantiago, a 28 y.o. female, was seen for a [facility] cancer genetics consultation at the request of Dr. Marin Olp due to a personal and family history of colon cancer.  Ms. Salce presents to clinic today to discuss the possibility of a hereditary predisposition to cancer, genetic testing, and to further clarify her future cancer risks, as well as potential cancer risks for family members.   In November 2020, at the age of 59, Ms. 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  She had innumerous rectal polyps that were not removed.     CANCER HISTORY:  Oncology History   No history exists.      Past Medical History:  Diagnosis Date  . Family history of colon cancer   . Mass of colon   . Polyposis of colon   . PUD (peptic ulcer disease)     Past Surgical History:  Procedure Laterality Date  . APPENDECTOMY    . BIOPSY  02/18/2019   Procedure: BIOPSY;  Surgeon: Ronnette Juniper, MD;  Location: [facility];  Service: Gastroenterology;;  . Otho Darner SIGMOIDOSCOPY N/A 02/18/2019   Procedure: Beryle Quant;  Surgeon: Ronnette Juniper, MD;  Location: [facility];  Service: Gastroenterology;  Laterality: N/A;  . HERNIA REPAIR    . TUBAL LIGATION      Social History   Socioeconomic History  . Marital status: Single    Spouse name: Not on file  . Number of children: 3  . Years of education: Not on file  . Highest education level: Not on file  Occupational History  . Not on file  Social Needs  . Financial resource strain: Not on file  . Food insecurity    Worry: Not on file    Inability: Not on file  . Transportation needs    Medical: Not on file     Non-medical: Not on file  Tobacco Use  . Smoking status: Never Smoker  . Smokeless tobacco: Never Used  Substance and Sexual Activity  . Alcohol use: Not Currently  . Drug use: Not on file  . Sexual activity: Not on file  Lifestyle  . Physical activity    Days per week: Not on file    Minutes per session: Not on file  . Stress: Not on file  Relationships  . Social Herbalist on phone: Not on file    Gets together: Not on file    Attends religious service: Not on file    Active member of club or organization: Not on file    Attends meetings of clubs or organizations: Not on file    Relationship status: Not on file  Other Topics Concern  . Not on file  Social History Narrative  . Not on file     FAMILY HISTORY:  We obtained a detailed, 4-generation family history.  Significant diagnoses are listed below: Family History  Problem Relation Age of Onset  . Colon cancer Mother 22       d. 1s  . Colon cancer Brother        dx in his 37s  .  Healthy Sister        normal colonoscopy  . Colon cancer Maternal Grandfather        possible colon cancer    The patient has two sons and a daughter who are cancer free.  She has a twin brother who had colon cancer at age 70, and two maternal half sisters who are cancer free.  She has a paternal half sister who is cancer free. Her mother is deceased and her father is living.  Her mother was diagnosed with colon cancer at 98 and died at 26.  She had a maternal half brother and half sister who are deceased.  The grandmother is living and the grandfather is deceased, possibly from colon cancer.  The patient's father is living.  He has many siblings who have not had cancer.  His parents are deceased from unknown reasons.  Ms. Hahne is unaware of previous family history of genetic testing for hereditary cancer risks. Patient's maternal ancestors are of African American descent, and paternal ancestors are of African American descent.  There is no reported Ashkenazi Jewish ancestry. There is no known consanguinity.  GENETIC COUNSELING ASSESSMENT: Ms. Zalar is a 28 y.o. female with a personal history of a suspected colon cancer and family history of colon cancer which is somewhat suggestive of a hereditary cancer syndrome and predisposition to cancer given the early onset of colon cancer in her family members and her polyposis. We, therefore, discussed and recommended the following at today's visit.   DISCUSSION: We discussed that 1% of polyposis is due to hereditary mutations, with most cases associated with APC or MUTYH mutations.  There are other genes that can be associated with hereditary colon cancer syndromes.  These include the Lynch syndrome genes.  Based on the family history, MUTYH mutations is unlikely as this is a recessive condition and the family history appears like dominant inheritance.  Additionally, it is unlikely due to Lynch syndrome as there is polyposis involved with her diagnosis.  Therefore, we are most concerned about Familial Adenomatous Polyposis (FAP).  We discussed that testing is beneficial for several reasons including knowing how to follow individuals after completing their treatment,and understand if other family members could be at risk for cancer and allow them to undergo genetic testing. Specifically, we would need to get her children in for testing, as FAP is a condition that can increase the risk for tumors and cancer in children.  We reviewed the characteristics, features and inheritance patterns of hereditary cancer syndromes. We also discussed genetic testing, including the appropriate family members to test, the process of testing, insurance coverage and turn-around-time for results. We discussed the implications of a negative, positive and/or variant of uncertain significant result. In order to get genetic test results in a timely manner so that Ms. Fryer can use these genetic test results for  surgical decisions, we recommended Ms. Murillo pursue genetic testing for the ColoNext panel testing. Once complete, we recommend Ms. Ysidro Evert pursue reflex genetic testing to the CancerNext-expanded+RNAinsight gene panel. The CustomNext-Cancer gene panel offered by [facility] and includes sequencing and rearrangement analysis for the following 91 genes: AIP, ALK, APC*, ATM*, AXIN2, BAP1, BARD1, BLM, BMPR1A, BRCA1*, BRCA2*, BRIP1*, CDC73, CDH1*, CDK4, CDKN1B, CDKN2A, CHEK2*, CTNNA1, DICER1, FANCC, FH, FLCN, GALNT12, KIF1B, LZTR1, MAX, MEN1, MET, MLH1*, MRE11A, MSH2*, MSH3, MSH6*, MUTYH*, NBN, NF1*, NF2, NTHL1, PALB2*, PHOX2B, PMS2*, POT1, PRKAR1A, PTCH1, PTEN*, RAD50, RAD51C*, RAD51D*, RB1, RECQL, RET, SDHA, SDHAF2, SDHB, SDHC, SDHD, SMAD4, SMARCA4, SMARCB1, SMARCE1, STK11,  SUFU, TMEM127, TP53*, TSC1, TSC2, VHL and XRCC2 (sequencing and deletion/duplication); CASR, CFTR, CPA1, CTRC, EGFR, EGLN1, FAM175A, HOXB13, KIT, MITF, MLH3, PALLD, PDGFRA, POLD1, POLE, PRSS1, RINT1, RPS20, SPINK1 and TERT (sequencing only); EPCAM and GREM1 (deletion/duplication only). DNA and RNA analyses performed for * genes.   Based on Ms. Boedecker's personal and family history of cancer, she meets medical criteria for genetic testing. Despite that she meets criteria, she may still have an out of pocket cost.   On 02/15/2019, Ms. Chenard had a blood transfusion with Red Blood Cells.  Per Ms. Ysidro Evert, she did not have another transfusion of blood after this date.  Per [facility] genetics, they can complete testing if the transfusion was 10 or more days prior to her blood testing.  I confirmed that 10 days out is enough time to wait for blood testing.  They confirmed that we can continue with testing today.  PLAN: After considering the risks, benefits, and limitations, Ms. Ysidro Evert provided informed consent to pursue genetic testing and the blood sample was sent to [facility] for analysis of the ColoNext panel testing and the  CancerNext-Expanded+RNAinsight testing. Results should be available within approximately 2-3 weeks' time, at which point they will be disclosed by telephone to Ms. Delaine, as will any additional recommendations warranted by these results. Ms. Loseke will receive a summary of her genetic counseling visit and a copy of her results once available. This information will also be available in Epic.   Lastly, we encouraged Ms. Giel to remain in contact with cancer genetics annually so that we can continuously update the family history and inform her of any changes in cancer genetics and testing that may be of benefit for this family.   Ms. Kees questions were answered to her satisfaction today. Our contact information was provided should additional questions or concerns arise. Thank you for the referral and allowing [place] to share in the care of your patient.    P. Florene Glen, [place], [facility] Licensed, Insurance risk surveyor [email or web address] phone: [phone]  The patient was seen for a total of 35 minutes in face-to-face genetic counseling.  This patient was discussed with Drs. Magrinat, Lindi Adie and/or Burr Medico who agrees with the above.    _______________________________________________________________________ For Office Staff:  Number of people involved in session: 1 Was an Intern/ student involved with case: no

## 2019-02-25 NOTE — Telephone Encounter (Signed)
Called to see if patient was still planning on coming to the appointment today.  She called back and let me know that she was running late and will be down in a moment.

## 2019-02-27 ENCOUNTER — Encounter: Payer: Self-pay | Admitting: Genetic Counselor

## 2019-03-04 ENCOUNTER — Encounter: Payer: 59 | Admitting: Genetic Counselor

## 2019-03-06 ENCOUNTER — Encounter: Payer: Self-pay | Admitting: *Deleted

## 2019-03-06 NOTE — Progress Notes (Signed)
Patient was seen by Dr Marin Olp while admitted to [facility]. Patient was discharged with the plan to follow up with GI once she had her genetics work up to plan for surgery. Her current path isn't diagnostic for colorectal cancer, but it is believed that this will be her final diagnosis.  Called patient and left a message on 03/05/2019 to determine whether patient had seen, or was scheduled to see GI after her genetic appointment last Monday, on 02/25/19. No answer, but message was left. Called again today with no contact.  Dr Marin Olp doesn't plan to see patient until after her surgery, when official pathology can be determined.   Will continue to make attempts to contact patient.

## 2019-03-08 ENCOUNTER — Telehealth: Payer: Self-pay | Admitting: Genetic Counselor

## 2019-03-08 ENCOUNTER — Encounter: Payer: Self-pay | Admitting: *Deleted

## 2019-03-08 ENCOUNTER — Encounter: Payer: Self-pay | Admitting: Genetic Counselor

## 2019-03-08 DIAGNOSIS — D126 Benign neoplasm of colon, unspecified: Secondary | ICD-10-CM | POA: Insufficient documentation

## 2019-03-08 DIAGNOSIS — K635 Polyp of colon: Secondary | ICD-10-CM

## 2019-03-08 DIAGNOSIS — Z1379 Encounter for other screening for genetic and chromosomal anomalies: Secondary | ICD-10-CM | POA: Insufficient documentation

## 2019-03-08 DIAGNOSIS — K6389 Other specified diseases of intestine: Secondary | ICD-10-CM

## 2019-03-08 DIAGNOSIS — C189 Malignant neoplasm of colon, unspecified: Secondary | ICD-10-CM

## 2019-03-08 MED ORDER — DICYCLOMINE HCL 20 MG PO TABS: 20.0000 mg | ORAL_TABLET | Freq: Four times a day (QID) | ORAL | 3 refills | Status: AC

## 2019-03-08 NOTE — Progress Notes (Signed)
I was able to make contact with patient today. Patient has been seen by Dr Dema Severin of GI since her discharge from the hospital. He is transferring her care to Dr Morton Stall at [facility]. She has an appointment to see him on 03/14/2019.   While talking, she states she is still having significant pain to her LEFT lower abdomen. She describes it as cramping, similar to contractions. She would like to know what she can take for this pain.   Updated Dr Marin Olp to the above. Also spoke regarding her pain medication request. Prescription for Bentyl will be sent to her pharmacy.  Patient aware of new prescription. Pharmacy confirmed.

## 2019-03-08 NOTE — Telephone Encounter (Addendum)
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  Patient and her family are at high risk for colon cancer, and this test may inform her surgery.  Emphasized that she needs to have a copy of this result with her when she sees her surgeon on Thursday.  Patient will be seen for genetic counseling on Wed, 12/9

## 2019-03-08 NOTE — Telephone Encounter (Signed)
LM on VM that results are back and to please call.  Left CB instructions. 

## 2019-03-11 ENCOUNTER — Telehealth: Payer: Self-pay | Admitting: Genetic Counselor

## 2019-03-11 NOTE — Telephone Encounter (Signed)
Called patient regarding upcoming virtual visit, patient is notified. 

## 2019-03-12 ENCOUNTER — Other Ambulatory Visit: Payer: Self-pay

## 2019-03-12 DIAGNOSIS — Z79899 Other long term (current) drug therapy: Secondary | ICD-10-CM | POA: Insufficient documentation

## 2019-03-12 DIAGNOSIS — K921 Melena: Secondary | ICD-10-CM | POA: Insufficient documentation

## 2019-03-12 DIAGNOSIS — R1032 Left lower quadrant pain: Secondary | ICD-10-CM | POA: Insufficient documentation

## 2019-03-12 DIAGNOSIS — Z85038 Personal history of other malignant neoplasm of large intestine: Secondary | ICD-10-CM | POA: Diagnosis not present

## 2019-03-12 DIAGNOSIS — R1110 Vomiting, unspecified: Secondary | ICD-10-CM | POA: Diagnosis not present

## 2019-03-12 LAB — CBC WITH DIFFERENTIAL/PLATELET
Abs Immature Granulocytes: 0.04 10*3/uL (ref 0.00–0.07)
Basophils Absolute: 0 10*3/uL (ref 0.0–0.1)
Basophils Relative: 0 %
Eosinophils Absolute: 0.3 10*3/uL (ref 0.0–0.5)
Eosinophils Relative: 2 %
HCT: 37.6 % (ref 36.0–46.0)
Hemoglobin: 10.7 g/dL — ABNORMAL LOW (ref 12.0–15.0)
Immature Granulocytes: 0 %
Lymphocytes Relative: 19 %
Lymphs Abs: 2.1 10*3/uL (ref 0.7–4.0)
MCH: 21.8 pg — ABNORMAL LOW (ref 26.0–34.0)
MCHC: 28.5 g/dL — ABNORMAL LOW (ref 30.0–36.0)
MCV: 76.6 fL — ABNORMAL LOW (ref 80.0–100.0)
Monocytes Absolute: 0.5 10*3/uL (ref 0.1–1.0)
Monocytes Relative: 5 %
Neutro Abs: 8 10*3/uL — ABNORMAL HIGH (ref 1.7–7.7)
Neutrophils Relative %: 74 %
Platelets: 1321 10*3/uL (ref 150–400)
RBC: 4.91 MIL/uL (ref 3.87–5.11)
WBC: 11 10*3/uL — ABNORMAL HIGH (ref 4.0–10.5)
nRBC: 0 % (ref 0.0–0.2)

## 2019-03-12 LAB — COMPREHENSIVE METABOLIC PANEL
ALT: 26 U/L (ref 0–44)
AST: 19 U/L (ref 15–41)
Albumin: 4.3 g/dL (ref 3.5–5.0)
Alkaline Phosphatase: 40 U/L (ref 38–126)
Anion gap: 8 (ref 5–15)
BUN: 9 mg/dL (ref 6–20)
CO2: 27 mmol/L (ref 22–32)
Calcium: 9.3 mg/dL (ref 8.9–10.3)
Chloride: 102 mmol/L (ref 98–111)
Creatinine, Ser: 0.72 mg/dL (ref 0.44–1.00)
GFR calc Af Amer: >60 mL/min (ref >60)
GFR calc non Af Amer: >60 mL/min (ref >60)
Glucose, Bld: 90 mg/dL (ref 70–99)
Potassium: 4.3 mmol/L (ref 3.5–5.1)
Sodium: 137 mmol/L (ref 135–145)
Total Bilirubin: 0.7 mg/dL (ref 0.3–1.2)
Total Protein: 7.9 g/dL (ref 6.5–8.1)

## 2019-03-12 LAB — LIPASE, BLOOD: Lipase: 28 U/L (ref 11–51)

## 2019-03-12 LAB — I-STAT BETA HCG BLOOD, ED (MC, WL, AP ONLY): I-stat hCG, quantitative: <5 m[IU]/mL (ref <5)

## 2019-03-12 NOTE — ED Triage Notes (Addendum)
Pt presents with c/o of abdominal pain and N/V. Pt was recently dx with a colon mass will be seen by gen surgery on 12/10. Pt went to [facility] yesterday d/t rectal pain/bleeding, however left AMA due to wait. Pt states her last BM was yesterday and noted blood in stool which has been consistent. Pt reports that she is now she is very nauseous and has experienced 2 episodes of vomiting today.

## 2019-03-13 ENCOUNTER — Emergency Department (HOSPITAL_COMMUNITY)
Admission: EM | Admit: 2019-03-13 | Discharge: 2019-03-13 | Disposition: A | Discharge status: Home / Self Care | Payer: 59 | Attending: Emergency Medicine | Admitting: Emergency Medicine

## 2019-03-13 ENCOUNTER — Inpatient Hospital Stay: Payer: 59 | Attending: Genetic Counselor | Admitting: Genetic Counselor

## 2019-03-13 ENCOUNTER — Emergency Department (HOSPITAL_COMMUNITY): Payer: 59

## 2019-03-13 ENCOUNTER — Encounter: Payer: Self-pay | Admitting: Genetic Counselor

## 2019-03-13 DIAGNOSIS — Z1379 Encounter for other screening for genetic and chromosomal anomalies: Secondary | ICD-10-CM

## 2019-03-13 DIAGNOSIS — R1032 Left lower quadrant pain: Secondary | ICD-10-CM

## 2019-03-13 DIAGNOSIS — D126 Benign neoplasm of colon, unspecified: Secondary | ICD-10-CM

## 2019-03-13 DIAGNOSIS — Z85038 Personal history of other malignant neoplasm of large intestine: Secondary | ICD-10-CM

## 2019-03-13 LAB — URINALYSIS, ROUTINE W REFLEX MICROSCOPIC
Bacteria, UA: NONE SEEN
Bilirubin Urine: NEGATIVE
Glucose, UA: NEGATIVE mg/dL
Hgb urine dipstick: NEGATIVE
Ketones, ur: 5 mg/dL — AB
Nitrite: NEGATIVE
Protein, ur: NEGATIVE mg/dL
Specific Gravity, Urine: 1.019 (ref 1.005–1.030)
pH: 7 (ref 5.0–8.0)

## 2019-03-13 LAB — HEMOGLOBIN AND HEMATOCRIT, BLOOD
HCT: 36.6 % (ref 36.0–46.0)
Hemoglobin: 10.5 g/dL — ABNORMAL LOW (ref 12.0–15.0)

## 2019-03-13 LAB — PATHOLOGIST SMEAR REVIEW

## 2019-03-13 MED ORDER — HYDROCODONE-ACETAMINOPHEN 5-325 MG PO TABS: 1.0000 | ORAL_TABLET | Freq: Four times a day (QID) | ORAL | 0 refills | Status: AC | PRN

## 2019-03-13 NOTE — Discharge Instructions (Addendum)
Take hydrocodone as prescribed as needed for pain.  Follow-up with your surgeon on Thursday as previously arranged.

## 2019-03-13 NOTE — ED Notes (Signed)
Pt asked to provide urine specimen, not able to at this time.

## 2019-03-13 NOTE — Progress Notes (Signed)
GENETIC TEST RESULTS   Patient Name: Briana Powers Patient Age: 28 y.o. Encounter Date: 03/13/2019  Referring Provider: Nadeen Landau, MD    Briana Powers was seen in the [facility] on March 13, 2019 due to a personal and family history of cancer and concern regarding a hereditary predisposition to cancer in the family. Please refer to the prior Genetics clinic note for more information regarding Briana Powers's medical and family histories and our assessment at the time.   FAMILY HISTORY:  We obtained a detailed, 4-generation family history.  Significant diagnoses are listed below: Family History  Problem Relation Age of Onset  . Colon cancer Mother 34       d. 27s  . Colon cancer Brother        dx in his 45s  . Healthy Sister        normal colonoscopy  . Colon cancer Father        d. late 12s-50s  . Colon cancer Maternal Grandfather        possible colon cancer  . Colon cancer Paternal Grandfather        ?? possible colon cancer    The patient has two sons and a daughter who are cancer free.  She has a twin brother who had colon cancer at age 30, and two maternal half sisters who are cancer free.  She has a paternal half sister who is cancer free. Her mother is deceased and her father is living.  Her mother was diagnosed with colon cancer at 66 and died at 29.  She had a maternal half brother and half sister who are deceased.  The grandmother is living and the grandfather is deceased, possibly from colon cancer.  The patient's father is living.  He has many siblings who have not had cancer.  His parents are deceased from unknown reasons.  Briana Powers is unaware of previous family history of genetic testing for hereditary cancer risks. Patient's maternal ancestors are of African American descent, and paternal ancestors are of African American descent. There is no reported Ashkenazi Jewish ancestry. There is no known consanguinity.   GENETIC TESTING:  At the  time of Briana Powers's visit, we recommended she pursue genetic testing of the CancerNext-Expanded+RNAinsight test. 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 There were no deleterious mutations in  AIP, ALK, ATM*, AXIN2, BAP1, BARD1, BLM, BMPR1A, BRCA1*, BRCA2*, BRIP1*, CDC73, CDH1*, CDK4, CDKN1B, CDKN2A, CHEK2*, CTNNA1, DICER1, FANCC, FH, FLCN, GALNT12, KIF1B, LZTR1, MAX, MEN1, MET, MLH1*, MSH2*, MSH3, MSH6*, MUTYH*, NBN, NF1*, NF2, NTHL1, PALB2*, PHOX2B, PMS2*, POT1, PRKAR1A, PTCH1, PTEN*, RAD51C*, RAD51D*, RB1, RECQL, RET, SDHA, SDHAF2, SDHB, SDHC, SDHD, SMAD4, SMARCA4, SMARCB1, SMARCE1, STK11, SUFU, TMEM127, TP53*, TSC1, TSC2, VHL and XRCC2 (sequencing and deletion/duplication); EGFR, EGLN1, HOXB13, KIT, MITF, PDGFRA, POLD1, and POLE (sequencing only); EPCAM and GREM1 (deletion/duplication only). DNA and RNA analyses performed for * genes.  Clinical condition Familial adenomatous polyposis (FAP) is a colorectal cancer predisposition syndrome characterized by the development of hundreds to thousands of precancerous (adenomatous) polyps, typically beginning in adolescence or early adulthood. Without a prophylactic colectomy, affected individuals have a lifetime risk of nearly 100% of developing colorectal cancer (PMID: 40347425, 95638756, 4332951).  Gastric adenocarcinoma and proximal polyposis of the stomach (GAPPS) is characterized by fundic gland polyposis and an increased risk of gastric cancer (gastric adenocarcinoma). Polyps are often associated with  low-grade and focally high-grade dysplasia. Unlike other pathogenic variants in APC, the risk of colorectal polyposis and cancer appears to be lower. GAPPS is caused by pathogenic variants in promoter 1B of the APC gene (PMID: 10175102, 58527782, 42353614, 43154008, 67619509).  FAP was previously  divided into subtypes including [place] and Gardner syndromes. These subtypes were defined by the presence of certain extracolonic findings such as desmoid tumors, sebaceous cysts, osteomas, supernumerary teeth, and cancers of the duodenum, exocrine pancreas, thyroid (papillary adenocarcinoma), liver (hepatoblastomas), and central nervous system (medulloblastomas). It is now recognized that these subtypes are part of the clinical spectrum of APC-associated polyposis conditions (PMID: 32671245).  FAP may increase the risk of developing other types of non-colonic cancers:   4-12% risk of cancer of the duodenum (PMID: 80998338, 25053976)  1-12% risk of papillary thyroid cancer (PMID: 73419379)  <1% risk of stomach cancer (PMID: 02409735)  <1% risk of brain/central nervous system malignancies (PMID: 32992426)  1-2% risk of hepatoblastoma in children (PMID: 8341962, 2297989) increased risk of pancreatic cancer (PMID: 21194174, 08144818, 5631497)  Inheritance FAP has autosomal dominant inheritance. This means that an individual with a pathogenic variant has a 50% chance of passing the condition on to their offspring. With this result, it is now possible to identify at-risk relatives who can pursue testing for this specific familial variant. While many cases are inherited from a parent, some occur spontaneously (PMID: 701 035 4728). This means that an individual with a pathogenic variant has parents who do not have it. However, that individual now has a 50% risk of passing it on to future offspring.  Management Medical management and surveillance protocols have been developed by the [place] (NCCN) for individuals with FAP and AFAP (NCCN Clinical Practice Guidelines in Oncology: Genetic/Familial High Risk Assessment: Colorectal. Version 1.2020). These recommendations apply to individuals diagnosed with FAP and AFAP based on clinical findings or genetic test results, as well as  individuals at an increased risk based on family history who have not had negative genetic testing:  Classic FAP Colon:   Annual sigmoidoscopy/colonoscopy beginning at 22-18 years of age.  A colectomy or proctocolectomy is recommended after numerous polyps are detected, due to their high malignant potential. Total proctocolectomy with ileal pouch-anal anastomosis (IPAA) is generally the recommended surgical approach for individuals with FAP.  If colectomy with ileorectal anastomosis (IRA) is performed, endoscopic evaluation of the remaining rectum is recommended every 6-12 months depending on polyp burden.  If total proctocolectomy with IPAA or ileostomy is performed, endoscopic evaluation of the ileal pouch or ileostomy is recommended every 1-3 years depending on polyp burden. If large, flat polyps with villous histology or polyps with high-grade dysplasia are identified, then surveillance frequency should be every 6 months.  Chemoprevention (e.g., sulindac) can aid in management of the remaining rectum; however, there are no medications currently approved by the FDA for this indication. There are data to suggest that sulindac showed the most significant polyp regression, but it is unclear if the decrease in polyp burden equates to reduction in colorectal cancer risk.  Extracolonic:  Upper endoscopy with complete visualization of the ampulla of Vater beginning at age 20-25 years.  Consider upper endoscopy at an earlier age if colectomy is performed prior to 26 years of age. Frequency of upper endoscopy surveillance is dependent on polyp burden. It is important to note that fundic gland polyps are common in individuals with FAP and while focal low-grade dysplasia can be identified, it is typically non-progressive. Non-fundic gland polyps  should be managed endoscopically if possible. Polyps with high-grade dysplasia or invasive cancer detected on biopsy should be referred for gastrectomy.   Annual thyroid examination beginning in the late teenage years. Annual thyroid ultrasound may be considered, but data are lacking to support this recommendation.  Annual physical examination for CNS cancers.  Annual abdominal palpation for desmoids.  If there is family history of symptomatic desmoids, consider abdominal MRI or CT within 1-3 years post-colectomy, then every 5-10 years. Abdominal symptoms should prompt immediate abdominal imaging; however, data to support screening and treatment are limited. For small bowel polyps and cancer, consider adding small bowel visualization to MRI or CT for desmoids especially, if duodenal polyposis is advanced  Consider liver palpation, abdominal ultrasound, and measurement of AFP every 3-6 months during the first 5 years of life to screen for hepatoblastoma.  UNAFFECTED INDIVIDUALS: If there is no personal history of colorectal cancer but there is a diagnosis of colorectal cancer in a first-degree relative, colonoscopy is recommended every 5 years beginning at age 67, or 71 years prior to the first-degree relative's age at diagnosis. If there is no personal history of colorectal cancer and no diagnosis of colorectal cancer in a first-degree relative, colonoscopy is recommended every 5 years beginning at age 81.  AFFECTED INDIVIDUALS: Patients with colon cancer and this variant should follow guidelines post cancer resection  An individual's cancer risk and medical management are not determined by genetic test results alone. Overall cancer risk assessment incorporates additional factors, including personal medical history, family history, and any available genetic information that may result in a personalized plan for cancer prevention and surveillance.   FAMILY MEMBERS: It is important that all of Ms. Dickerman's relatives (both men and women) know of the presence of this gene mutation. Site-specific genetic testing can sort out who in the family is at risk  and who is not.   It is advantageous to know if an individual has a pathogenic variant in APC as medical management recommendations can be implemented. At-risk relatives can be identified, allowing pursuit of a diagnostic evaluation. In addition, the available information regarding hereditary cancer susceptibility genes is constantly evolving and more clinically relevant APC data are likely to become available in the near future. Awareness of this cancer predisposition encourages patients and their providers to inform at-risk family members, to diligently follow standard screening protocols, and to be vigilant in maintaining close and regular contact with their local genetics clinic in anticipation of new information.  Ms. Vohs children and siblings have a 50% chance to have inherited this mutation. We recommend they have genetic testing for this same mutation, as identifying the presence of this mutation would allow them to also take advantage of risk-reducing measures.   Since Ms. Drollinger children are minors, they are being referred to [facility].  Ms. Strickler brother reportedly had colon cancer at age 34 but had not had genetic testing.  He is currently incarcerated.  We discussed that he can have genetic testing in the next 90 days for free if we can get someone at his facility to draw blood.  She will contact him and ask him how this may be facilitated.  SUPPORT AND RESOURCES: If Ms. Dimmitt is interested in FAP-specific information and support, the community support group, 'It takes guts", is a great place for resources. They provide opportunities to speak with other individuals from high-risk families. To locate genetic counselors in other cities, visit the website of the [employer] ([employer]) and search for a counselor by zip code.  We encouraged Ms. Gadomski to remain in contact with [place] on an annual basis so we can update her  personal and family histories, and let her know of advances in cancer genetics that may benefit the family. Our contact number was provided. Ms. Buis questions were answered to her satisfaction today, and she knows she is welcome to call anytime with additional questions.   Tianah Lonardo P. Florene Glen, [place], [facility] Licensed, Insurance risk surveyor [email or web address] phone: [phone]  The patient was seen for a total of 30 minutes in virtual genetic counseling.

## 2019-03-13 NOTE — ED Provider Notes (Signed)
[facility] DEPT Provider Note   CSN: [id number] Arrival date & time: 03/12/19  1741     History   Chief Complaint Chief Complaint  Patient presents with  . Abdominal Pain  . Emesis    HPI Briana Powers is a 28 y.o. female.     Patient is a 28 year old female with history of familial polyposis and recently diagnosed colon cancer awaiting surgical consultation.  She presents today for evaluation of abdominal pain.  She reports discomfort in the left side of her abdomen worsening over the past 2 days.  She has vomited twice.  She had a normal bowel movement yesterday but has not had a bowel movement today.  She denies any fevers or chills.  She does report some bleeding with her bowel movement yesterday.  The history is provided by the patient.  Abdominal Pain Pain location:  LLQ Pain quality: cramping   Pain radiates to:  Does not radiate Pain severity:  Moderate Timing:  Constant Progression:  Worsening Chronicity:  New Relieved by:  Nothing Worsened by:  Movement and palpation Associated symptoms: vomiting   Emesis Associated symptoms: abdominal pain     Past Medical History:  Diagnosis Date  . Family history of colon cancer   . Mass of colon   . Polyposis of colon   . PUD (peptic ulcer disease)     Patient Active Problem List   Diagnosis Date Noted  . Genetic testing 03/08/2019  . Familial adenomatous polyposis 03/08/2019  . Family history of colon cancer   . Polyposis of colon   . Mass of colon   . Symptomatic anemia 02/14/2019    Past Surgical History:  Procedure Laterality Date  . APPENDECTOMY    . BIOPSY  02/18/2019   Procedure: BIOPSY;  Surgeon: Ronnette Juniper, MD;  Location: [facility];  Service: Gastroenterology;;  . Otho Darner SIGMOIDOSCOPY N/A 02/18/2019   Procedure: Beryle Quant;  Surgeon: Ronnette Juniper, MD;  Location: [facility];  Service: Gastroenterology;  Laterality: N/A;  . HERNIA REPAIR    . TUBAL  LIGATION       OB History    Gravida  2   Para  1   Term  1   Preterm  0   AB  0   Living  1     SAB  0   TAB  0   Ectopic  0   Multiple  0   Live Births               Home Medications    Prior to Admission medications   Medication Sig Start Date End Date Taking? Authorizing Provider  dicyclomine (BENTYL) 20 MG tablet Take 1 tablet (20 mg total) by mouth every 6 (six) hours. 03/08/19   Volanda Napoleon, MD  polyethylene glycol (MIRALAX / GLYCOLAX) 17 g packet Take 17 g by mouth daily. 02/24/19   Shelly Coss, MD    Family History Family History  Problem Relation Age of Onset  . Colon cancer Mother 13       d. 14s  . Colon cancer Brother        dx in his 67s  . Healthy Sister        normal colonoscopy  . Colon cancer Father        d. late 53s-50s  . Colon cancer Maternal Grandfather        possible colon cancer  . Colon cancer Paternal Grandfather        ??  possible colon cancer    Social History Social History   Tobacco Use  . Smoking status: Never Smoker  . Smokeless tobacco: Never Used  Substance Use Topics  . Alcohol use: Not Currently  . Drug use: Not on file     Allergies   Patient has no known allergies.   Review of Systems Review of Systems  Gastrointestinal: Positive for abdominal pain and vomiting.  All other systems reviewed and are negative.    Physical Exam Updated Vital Signs BP 102/70 (BP Location: Left Arm)   Pulse 80   Temp 98.1 F (36.7 C) (Oral)   Resp 13   LMP 02/28/2019   SpO2 100%   Physical Exam Vitals signs and nursing note reviewed.  Constitutional:      General: She is not in acute distress.    Appearance: She is well-developed. She is not diaphoretic.  HENT:     Head: Normocephalic and atraumatic.  Neck:     Musculoskeletal: Normal range of motion and neck supple.  Cardiovascular:     Rate and Rhythm: Normal rate and regular rhythm.     Heart sounds: No murmur. No friction rub. No gallop.    Pulmonary:     Effort: Pulmonary effort is normal. No respiratory distress.     Breath sounds: Normal breath sounds. No wheezing.  Abdominal:     General: Bowel sounds are normal. There is no distension.     Palpations: Abdomen is soft.     Tenderness: There is abdominal tenderness in the left lower quadrant. There is no right CVA tenderness, left CVA tenderness, guarding or rebound.  Musculoskeletal: Normal range of motion.  Skin:    General: Skin is warm and dry.  Neurological:     Mental Status: She is alert and oriented to person, place, and time.      ED Treatments / Results  Labs (all labs ordered are listed, but only abnormal results are displayed) Labs Reviewed  CBC WITH DIFFERENTIAL/PLATELET - Abnormal; Notable for the following components:      Result Value   WBC 11.0 (*)    Hemoglobin 10.7 (*)    MCV 76.6 (*)    MCH 21.8 (*)    MCHC 28.5 (*)    Platelets 1,321 (*)    Neutro Abs 8.0 (*)    All other components within normal limits  COMPREHENSIVE METABOLIC PANEL  LIPASE, BLOOD  URINALYSIS, ROUTINE W REFLEX MICROSCOPIC  PATHOLOGIST SMEAR REVIEW  HEMOGLOBIN AND HEMATOCRIT, BLOOD  I-STAT BETA HCG BLOOD, ED ([facility], [facility], [facility] ONLY)    EKG None  Radiology No results found.  Procedures Procedures (including critical care time)  Medications Ordered in ED Medications - No data to display   Initial Impression / Assessment and Plan / ED Course  I have reviewed the triage vital signs and the nursing notes.  Pertinent labs & imaging results that were available during my care of the patient were reviewed by me and considered in my medical decision making (see chart for details).  Patient is a 28 year old female with recently diagnosed colon cancer related to familial polyposis.  She presents today with abdominal pain and no bowel movement since yesterday.  Yesterday's bowel movement was slightly bloody.  Patient is hemodynamically stable and hemoglobin is also at  baseline.  Laboratory studies are otherwise reassuring.  Acute abdominal series shows no evidence for obstruction and urinalysis not consistent with UTI.  At this point, I feel as though patient is appropriate for  discharge.  She is requesting something for pain which will be prescribed.  She has an appointment tomorrow with her surgeon at [facility].  Final Clinical Impressions(s) / ED Diagnoses   Final diagnoses:  None    ED Discharge Orders    None       Veryl Speak, MD 03/13/19 [phone]

## 2019-03-22 ENCOUNTER — Encounter: Payer: Self-pay | Admitting: *Deleted

## 2019-03-22 MED ORDER — ONDANSETRON HCL 4 MG/2ML IJ SOLN
4.00 | INTRAMUSCULAR | Status: DC
Start: ? — End: 2019-03-22

## 2019-03-22 MED ORDER — CYCLOBENZAPRINE HCL 5 MG PO TABS
5.00 | ORAL_TABLET | ORAL | Status: DC
Start: ? — End: 2019-03-22

## 2019-03-22 MED ORDER — TRAMADOL HCL 50 MG PO TABS
50.00 | ORAL_TABLET | ORAL | Status: DC
Start: ? — End: 2019-03-22

## 2019-03-22 MED ORDER — ENOXAPARIN SODIUM 40 MG/0.4ML ~~LOC~~ SOLN
40.00 | SUBCUTANEOUS | Status: DC
Start: 2019-03-23 — End: 2019-03-22

## 2019-03-22 MED ORDER — ASPIRIN 81 MG PO TBEC
81.00 | DELAYED_RELEASE_TABLET | ORAL | Status: DC
Start: 2019-03-22 — End: 2019-03-22

## 2019-03-22 MED ORDER — ACETAMINOPHEN 325 MG PO TABS
650.00 | ORAL_TABLET | ORAL | Status: DC
Start: 2019-03-22 — End: 2019-03-22

## 2019-03-22 MED ORDER — OXYCODONE HCL 5 MG PO TABS
5.00 | ORAL_TABLET | ORAL | Status: DC
Start: ? — End: 2019-03-22

## 2019-03-22 NOTE — Progress Notes (Signed)
Reviewed patient chart. She has had surgery and continues to be an inpatient at [facility]. Per their documentation, she plans on discharging to [facility]. Pathology reviewed with Dr Marin Olp.  Will follow up with patient after discharge to determine long term post discharge plans to see if she will continue care at this office, or plans to establish care in [place].

## 2019-04-02 ENCOUNTER — Encounter: Payer: Self-pay | Admitting: *Deleted

## 2019-04-02 NOTE — Progress Notes (Signed)
Spoke with patient. She is doing well after surgery with no complications. She is following up with medical oncology and has her first chemotherapy treatment scheduled outside this system. Patient knows that if her needs change, she can reach out to this office for continuation of care. Will discontinue active navigation at this time.

## 2019-10-15 ENCOUNTER — Emergency Department (HOSPITAL_BASED_OUTPATIENT_CLINIC_OR_DEPARTMENT_OTHER)
Admission: EM | Admit: 2019-10-15 | Discharge: 2019-10-15 | Disposition: A | Discharge status: Home / Self Care | Payer: Medicaid Other | Attending: Emergency Medicine | Admitting: Emergency Medicine

## 2019-10-15 ENCOUNTER — Other Ambulatory Visit: Payer: Self-pay

## 2019-10-15 ENCOUNTER — Encounter (HOSPITAL_BASED_OUTPATIENT_CLINIC_OR_DEPARTMENT_OTHER): Payer: Self-pay | Admitting: *Deleted

## 2019-10-15 DIAGNOSIS — Z113 Encounter for screening for infections with a predominantly sexual mode of transmission: Secondary | ICD-10-CM | POA: Diagnosis not present

## 2019-10-15 HISTORY — DX: Malignant (primary) neoplasm, unspecified: C80.1

## 2019-10-15 LAB — WET PREP, GENITAL
Clue Cells Wet Prep HPF POC: NONE SEEN
Sperm: NONE SEEN
Trich, Wet Prep: NONE SEEN
Yeast Wet Prep HPF POC: NONE SEEN

## 2019-10-15 LAB — URINALYSIS, ROUTINE W REFLEX MICROSCOPIC
Bilirubin Urine: NEGATIVE
Glucose, UA: NEGATIVE mg/dL
Hgb urine dipstick: NEGATIVE
Ketones, ur: NEGATIVE mg/dL
Leukocytes,Ua: NEGATIVE
Nitrite: NEGATIVE
Protein, ur: NEGATIVE mg/dL
Specific Gravity, Urine: >1.03 — ABNORMAL HIGH (ref 1.005–1.030)
pH: 5.5 (ref 5.0–8.0)

## 2019-10-15 LAB — PREGNANCY, URINE: Preg Test, Ur: NEGATIVE
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
## 2019-10-15 NOTE — ED Provider Notes (Signed)
[facility] EMERGENCY DEPARTMENT Provider Note   CSN: [id number] Arrival date & time: 10/15/19  1935     History Chief Complaint  Patient presents with  . SEXUALLY TRANSMITTED DISEASE    Briana Powers is a 29 y.o. female presenting for STD testing.  Patient states she had unprotected sex with a new partner several days ago.  She is not having any vaginal discharge, urinary symptoms, lower abdominal pain, or symptoms of STD.  However she is currently receiving chemo for colon cancer, last treatment was 5 days ago and next treatment is scheduled for several days from now.  As such, she would like to be screened for infections.  She had normal blood work with her PCP, she is requesting STD testing only.  She has a history of stage IV colon cancer, PUD.   HPI     Past Medical History:  Diagnosis Date  . Cancer ([place])   . Family history of colon cancer   . Mass of colon   . Polyposis of colon   . PUD (peptic ulcer disease)     Patient Active Problem List   Diagnosis Date Noted  . Genetic testing 03/08/2019  . Familial adenomatous polyposis 03/08/2019  . Family history of colon cancer   . Polyposis of colon   . Mass of colon   . Symptomatic anemia 02/14/2019    Past Surgical History:  Procedure Laterality Date  . APPENDECTOMY    . BIOPSY  02/18/2019   Procedure: BIOPSY;  Surgeon: Ronnette Juniper, MD;  Location: [facility];  Service: Gastroenterology;;  . COLON SURGERY    . FLEXIBLE SIGMOIDOSCOPY N/A 02/18/2019   Procedure: FLEXIBLE SIGMOIDOSCOPY;  Surgeon: Ronnette Juniper, MD;  Location: [facility];  Service: Gastroenterology;  Laterality: N/A;  . HERNIA REPAIR    . TUBAL LIGATION       OB History    Gravida  2   Para  1   Term  1   Preterm  0   AB  0   Living  1     SAB  0   TAB  0   Ectopic  0   Multiple  0   Live Births              Family History  Problem Relation Age of Onset  . Colon cancer Mother 36       d. 89s  . Colon  cancer Brother        dx in his 42s  . Healthy Sister        normal colonoscopy  . Colon cancer Father        d. late 72s-50s  . Colon cancer Maternal Grandfather        possible colon cancer  . Colon cancer Paternal Grandfather        ?? possible colon cancer    Social History   Tobacco Use  . Smoking status: Never Smoker  . Smokeless tobacco: Never Used  Substance Use Topics  . Alcohol use: Not Currently  . Drug use: Not on file    Home Medications Prior to Admission medications   Medication Sig Start Date End Date Taking? Authorizing Provider  oxyCODONE (OXY IR/ROXICODONE) 5 MG immediate release tablet Take by mouth. 09/09/19  Yes [provider]  dicyclomine (BENTYL) 20 MG tablet Take 1 tablet (20 mg total) by mouth every 6 (six) hours. Patient taking differently: Take 20 mg by mouth every 6 (six) hours as needed for  spasms.  03/08/19   Volanda Napoleon, MD  HYDROcodone-acetaminophen (NORCO) 5-325 MG tablet Take 1-2 tablets by mouth every 6 (six) hours as needed. 03/13/19   Veryl Speak, MD  polyethylene glycol (MIRALAX / GLYCOLAX) 17 g packet Take 17 g by mouth daily. Patient taking differently: Take 17 g by mouth daily as needed for mild constipation.  02/24/19   Shelly Coss, MD    Allergies    Patient has no known allergies.  Review of Systems   Review of Systems  Allergic/Immunologic: Positive for immunocompromised state.  All other systems reviewed and are negative.   Physical Exam Updated Vital Signs BP 110/76 (BP Location: Right Arm)   Pulse (!) 101   Temp 98.5 F (36.9 C) (Oral)   Resp 20   Ht 5\' 5"  (1.651 m)   Wt 53.1 kg   SpO2 100%   BMI 19.47 kg/m   Physical Exam Vitals and nursing note reviewed. Exam conducted with a chaperone present.  Constitutional:      General: She is not in acute distress.    Appearance: She is well-developed.     Comments: Sitting comfortably in the bed in no acute distress  HENT:     Head: Normocephalic  and atraumatic.  Eyes:     Conjunctiva/sclera: Conjunctivae normal.     Pupils: Pupils are equal, round, and reactive to light.  Cardiovascular:     Rate and Rhythm: Normal rate and regular rhythm.     Pulses: Normal pulses.  Pulmonary:     Effort: Pulmonary effort is normal. No respiratory distress.     Breath sounds: Normal breath sounds. No wheezing.  Abdominal:     General: There is no distension.     Palpations: Abdomen is soft. There is no mass.     Tenderness: There is no abdominal tenderness. There is no guarding or rebound.     Comments: No tenderness palpation of the abdomen  Genitourinary:    Vagina: Normal.     Cervix: Cervical bleeding present.     Uterus: Normal.      Adnexa: Right adnexa normal and left adnexa normal.     Comments: Minimal bleeding from the cervix, patient is on her period.  No obvious discharge.  No CMT or adnexal tenderness.  No other lesions or masses noted.  Musculoskeletal:        General: Normal range of motion.     Cervical back: Normal range of motion and neck supple.  Skin:    General: Skin is warm and dry.  Neurological:     Mental Status: She is alert and oriented to person, place, and time.     ED Results / Procedures / Treatments   Labs (all labs ordered are listed, but only abnormal results are displayed) Labs Reviewed  WET PREP, GENITAL - Abnormal; Notable for the following components:      Result Value   WBC, Wet Prep HPF POC MANY (*)    All other components within normal limits  URINALYSIS, ROUTINE W REFLEX MICROSCOPIC - Abnormal; Notable for the following components:   Specific Gravity, Urine >1.030 (*)    All other components within normal limits  PREGNANCY, URINE  GC/CHLAMYDIA PROBE AMP ([place]) NOT AT [facility]    EKG None  Radiology No results found.  Procedures Procedures (including critical care time)  Medications Ordered in ED Medications - No data to display  ED Course  I have reviewed the triage  vital signs and the  nursing notes.  Pertinent labs & imaging results that were available during my care of the patient were reviewed by me and considered in my medical decision making (see chart for details).    MDM Rules/Calculators/A&P                          Patient presenting for STD testing.  On exam, patient peers nontoxic.  No abdominal tenderness.  GU exam overall reassuring, there is mild bleeding but she is on her period.  UA and wet prep negative for acute infections.  Patient is aware gonorrhea and Chlamydia tests are pending.  She will follow-up for treatment as needed.  At this time, patient appears safe for discharge.  Return precautions given.  Patient states she understands and agrees to plan.  Final Clinical Impression(s) / ED Diagnoses Final diagnoses:  Screen for STD (sexually transmitted disease)    Rx / DC Orders ED Discharge Orders    None       Franchot Heidelberg, PA-C 10/15/19 2232    Drenda Freeze, MD 10/15/19 2325

## 2019-10-15 NOTE — ED Triage Notes (Signed)
States she wants to see if she has an STD.

## 2019-10-15 NOTE — Discharge Instructions (Signed)
Your testing was negative today. Your gonorrhea and Chlamydia tests are pending.  If positive, you will receive a phone call.  If negative, you will not.  Either way, you may check online on MyChart. Follow-up with primary care doctor as needed for further evaluation. Return to the emergency room if any new, worsening, concerning symptoms.

## 2019-10-16 ENCOUNTER — Telehealth (HOSPITAL_BASED_OUTPATIENT_CLINIC_OR_DEPARTMENT_OTHER): Payer: Self-pay

## 2019-10-17 LAB — GC/CHLAMYDIA PROBE AMP (~~LOC~~) NOT AT ARMC
Chlamydia: NEGATIVE
Comment: NEGATIVE
Comment: NORMAL
Neisseria Gonorrhea: NEGATIVE

## 2020-11-21 ENCOUNTER — Emergency Department (HOSPITAL_BASED_OUTPATIENT_CLINIC_OR_DEPARTMENT_OTHER)
Admission: EM | Admit: 2020-11-21 | Discharge: 2020-11-22 | Disposition: A | Discharge status: Home / Self Care | Payer: No Typology Code available for payment source | Attending: Emergency Medicine | Admitting: Emergency Medicine

## 2020-11-21 ENCOUNTER — Other Ambulatory Visit: Payer: Self-pay

## 2020-11-21 ENCOUNTER — Encounter (HOSPITAL_BASED_OUTPATIENT_CLINIC_OR_DEPARTMENT_OTHER): Payer: Self-pay | Admitting: Emergency Medicine

## 2020-11-21 DIAGNOSIS — R109 Unspecified abdominal pain: Secondary | ICD-10-CM | POA: Diagnosis present

## 2020-11-21 DIAGNOSIS — Z85038 Personal history of other malignant neoplasm of large intestine: Secondary | ICD-10-CM | POA: Diagnosis not present

## 2020-11-21 DIAGNOSIS — L7682 Other postprocedural complications of skin and subcutaneous tissue: Secondary | ICD-10-CM | POA: Diagnosis not present

## 2020-11-21 NOTE — ED Provider Notes (Signed)
[facility] DEPT [facility] Provider Note: Georgena Spurling, MD, FACEP  CSN: [id number] MRN: [id number] ARRIVAL: 11/21/20 at 2231 ROOM: [place]  Abdominal Pain   HISTORY OF PRESENT ILLNESS  11/21/20 11:29 PM Briana Powers is a 30 y.o. 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  She is status post total colectomy with ileostomy.  She underwent an abdominal wall resection with mesh placement on 11/09/2020 for a recurrence.  Earlier today she felt a "pop" near her incision at the site of the mesh.  This is now causing pain which radiates into her left groin.  She rates the pain is a 10 out of 10, worse with palpation or movement.  There is not an obvious deformity of the abdominal wall at that site.  She is not vomiting and her ileostomy is still having output.   Past Medical History:  Diagnosis Date   Cancer [facility])    Family history of colon cancer    Mass of colon    Polyposis of colon    PUD (peptic ulcer disease)     Past Surgical History:  Procedure Laterality Date   ABDOMINAL HYSTERECTOMY     APPENDECTOMY     BIOPSY  02/18/2019   Procedure: BIOPSY;  Surgeon: Ronnette Juniper, MD;  Location: [facility];  Service: Gastroenterology;;   COLON SURGERY     FLEXIBLE SIGMOIDOSCOPY N/A 02/18/2019   Procedure: Beryle Quant;  Surgeon: Ronnette Juniper, MD;  Location: [facility];  Service: Gastroenterology;  Laterality: N/A;   HERNIA REPAIR     TUBAL LIGATION      Family History  Problem Relation Age of Onset   Colon cancer Mother 23       d. 56s   Colon cancer Brother        dx in his 36s   Healthy Sister        normal colonoscopy   Colon cancer Father        d. late 10s-50s   Colon cancer Maternal Grandfather        possible colon cancer   Colon cancer Paternal Grandfather        ?? possible colon cancer    Social History   Tobacco Use   Smoking status: Never   Smokeless tobacco: Never  Substance Use  Topics   Alcohol use: Not Currently    Prior to Admission medications   Medication Sig Start Date End Date Taking? Authorizing Provider  dicyclomine (BENTYL) 20 MG tablet Take 1 tablet (20 mg total) by mouth every 6 (six) hours. Patient taking differently: Take 20 mg by mouth every 6 (six) hours as needed for spasms.  03/08/19   Volanda Napoleon, MD  HYDROcodone-acetaminophen (NORCO) 5-325 MG tablet Take 1-2 tablets by mouth every 6 (six) hours as needed. 03/13/19   Veryl Speak, MD  oxyCODONE (OXY IR/ROXICODONE) 5 MG immediate release tablet Take by mouth. 09/09/19   [provider]  polyethylene glycol (MIRALAX / GLYCOLAX) 17 g packet Take 17 g by mouth daily. Patient taking differently: Take 17 g by mouth daily as needed for mild constipation.  02/24/19   Shelly Coss, MD    Allergies Patient has no known allergies.   REVIEW OF SYSTEMS  Negative except as noted here or in the History of Present Illness.   PHYSICAL EXAMINATION  Initial Vital Signs Blood pressure 122/77, pulse 96, temperature 98.2 F (36.8 C), temperature source Oral, resp. rate 18, height '5\' 3"'$  (1.6  m), weight 66.2 kg, SpO2 100 %, unknown if currently breastfeeding.  Examination General: Well-developed, well-nourished female in no acute distress; appearance consistent with age of record HENT: normocephalic; atraumatic Eyes: pupils equal, round and reactive to light; extraocular muscles intact Neck: supple Heart: regular rate and rhythm Lungs: clear to auscultation bilaterally Abdomen: soft; well-healing infraumbilical midline incision with tenderness but no erythema, warmth or mass; right lower quadrant ileostomy; bowel sounds present Extremities: No deformity; full range of motion; pulses normal Neurologic: Awake, alert and oriented; motor function intact in all extremities and symmetric; no facial droop Skin: Warm and dry Psychiatric: Normal mood and affect   RESULTS  Summary of this visit's  results, reviewed and interpreted by myself:   EKG Interpretation  Date/Time:    Ventricular Rate:    PR Interval:    QRS Duration:   QT Interval:    QTC Calculation:   R Axis:     Text Interpretation:         Laboratory Studies: Results for orders placed or performed during the hospital encounter of 11/21/20 (from the past 24 hour(s))  CBC with Differential/Platelet     Status: Abnormal   Collection Time: 11/22/20 12:10 AM  Result Value Ref Range   WBC 8.4 4.0 - 10.5 K/uL   RBC 4.07 3.87 - 5.11 MIL/uL   Hemoglobin 11.3 (L) 12.0 - 15.0 g/dL   HCT 34.1 (L) 36.0 - 46.0 %   MCV 83.8 80.0 - 100.0 fL   MCH 27.8 26.0 - 34.0 pg   MCHC 33.1 30.0 - 36.0 g/dL   RDW 13.1 11.5 - 15.5 %   Platelets 349 150 - 400 K/uL   nRBC 0.0 0.0 - 0.2 %   Neutrophils Relative % 46 %   Neutro Abs 3.8 1.7 - 7.7 K/uL   Lymphocytes Relative 29 %   Lymphs Abs 2.4 0.7 - 4.0 K/uL   Monocytes Relative 7 %   Monocytes Absolute 0.6 0.1 - 1.0 K/uL   Eosinophils Relative 18 %   Eosinophils Absolute 1.5 (H) 0.0 - 0.5 K/uL   Basophils Relative 0 %   Basophils Absolute 0.0 0.0 - 0.1 K/uL   Immature Granulocytes 0 %   Abs Immature Granulocytes 0.02 0.00 - 0.07 K/uL  Basic metabolic panel     Status: None   Collection Time: 11/22/20 12:10 AM  Result Value Ref Range   Sodium 139 135 - 145 mmol/L   Potassium 3.9 3.5 - 5.1 mmol/L   Chloride 104 98 - 111 mmol/L   CO2 25 22 - 32 mmol/L   Glucose, Bld 96 70 - 99 mg/dL   BUN 14 6 - 20 mg/dL   Creatinine, Ser 0.83 0.44 - 1.00 mg/dL   Calcium 9.7 8.9 - 10.3 mg/dL   GFR, Estimated >60 >60 mL/min   Anion gap 10 5 - 15   Imaging Studies: CT ABDOMEN PELVIS W CONTRAST  Result Date: 11/22/2020 CLINICAL DATA:  Abdominal pain, hernia suspected. Postoperative pain. New onset pain after abdominal wall resection with Karlene Lineman 11/09/2020 EXAM: CT ABDOMEN AND PELVIS WITH CONTRAST TECHNIQUE: Multidetector CT imaging of the abdomen and pelvis was performed using the standard  protocol following bolus administration of intravenous contrast. CONTRAST:  73m OMNIPAQUE IOHEXOL 300 MG/ML  SOLN COMPARISON:  06/24/2020 FINDINGS: Lower chest: The lung bases are clear. Hepatobiliary: No focal liver lesions. Portal veins are patent. No bile duct dilatation. Gallbladder is not visualized, likely contracted or resected. Pancreas: Unremarkable. No pancreatic ductal dilatation or surrounding  inflammatory changes. Spleen: Normal in size without focal abnormality. Adrenals/Urinary Tract: Adrenal glands are unremarkable. Kidneys are normal, without renal calculi, focal lesion, or hydronephrosis. Bladder is unremarkable. Stomach/Bowel: Colonic resection with right lower quadrant ileostomy. Surgical clips in the mesentery. No bowel dilatation or wall thickening appreciated. There appears to be a ventral abdominal wall hernia at the midline near the umbilicus with possible tethering of bowel loops. No proximal obstruction. Presacral soft tissue likely representing postoperative change and similar to prior study. Vascular/Lymphatic: No significant vascular findings are present. No enlarged abdominal or pelvic lymph nodes. Reproductive: Uterus and bilateral adnexa are unremarkable. Other: No free air or free fluid in the abdomen. Postoperative scarring in the anterior abdominal wall. In the anterior wall of the pelvis, there is a somewhat amorphous poorly delineated fluid and gas collection which is centered at the rectus abdominus muscles and measures about 3.6 x 6.4 cm. This is likely a postoperative collection, possibly abscess. Soft tissue gas extends out from this collection to the skin surface, possibly indicating a cutaneous fistula. Musculoskeletal: No destructive bone lesions. IMPRESSION: 1. New finding of a fluid and gas collection in the low pelvic wall anteriorly extending from the subcutaneous fat into the rectus abdominus muscles. This likely represents a postoperative collection, possibly  abscess. 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 2. Previous postoperative change with colectomy and right lower quadrant ileostomy. Presacral soft tissue density appears similar to prior study, likely postoperative. 3. Scarring in the anterior abdominal wall with ventral hernia and tethering of bowel. No proximal obstruction. Electronically Signed   By: Lucienne Capers M.D.   On: 11/22/2020 01:33    ED COURSE and MDM  Nursing notes, initial and subsequent vitals signs, including pulse oximetry, reviewed and interpreted by myself.  Vitals:   11/21/20 2257 11/21/20 2258 11/22/20 0022  BP: 122/77    Pulse: 96    Resp: 18    Temp: 98.2 F (36.8 C)    TempSrc: Oral    SpO2: 100%  100%  Weight:  66.2 kg   Height:  '5\' 3"'$  (1.6 m)    Medications  fentaNYL (SUBLIMAZE) injection 100 mcg (has no administration in time range)  iohexol (OMNIPAQUE) 300 MG/ML solution 75 mL (75 mLs Intravenous Contrast Given 11/22/20 0059)  fentaNYL (SUBLIMAZE) injection 100 mcg (100 mcg Intravenous Given 11/22/20 0116)  ondansetron (ZOFRAN) injection 4 mg (4 mg Intravenous Given 11/22/20 0115)   2:13 AM Discussed the patient's case with this on-call surgical oncologist working with Dr. Clovis Riley.  He states that 2 weeks after the surgery with no erythema, redness, leukocytosis or fever he has had very low suspicion of this being an abscess.  He would like her pain treated but does not recommend an antibiotic.  He would like her to be followed up in the clinic.  The patient was advised of this plan.  She is already on pain management at home.   PROCEDURES  Procedures   ED DIAGNOSES     ICD-10-CM   1. Postoperative surgical complication involving subcutaneous tissue associated with non-dermatologic procedure, unspecified complication  [id number]          Sherree Shankman, Jenny Reichmann, MD 11/22/20 [id number]

## 2020-11-21 NOTE — ED Triage Notes (Signed)
Pt presents to ED Pov. Pt c/o abd pain. Pt reports that she had tumor resection on 8/8. Pt reports she is now having sharp/stabbing pain in post-op sight. Denies fever, redness, drainage, or swelling.

## 2020-11-22 ENCOUNTER — Emergency Department (HOSPITAL_BASED_OUTPATIENT_CLINIC_OR_DEPARTMENT_OTHER): Payer: No Typology Code available for payment source

## 2020-11-22 ENCOUNTER — Encounter (HOSPITAL_BASED_OUTPATIENT_CLINIC_OR_DEPARTMENT_OTHER): Payer: Self-pay | Admitting: Emergency Medicine

## 2020-11-22 LAB — CBC WITH DIFFERENTIAL/PLATELET
Abs Immature Granulocytes: 0.02 10*3/uL (ref 0.00–0.07)
Basophils Absolute: 0 10*3/uL (ref 0.0–0.1)
Basophils Relative: 0 %
Eosinophils Absolute: 1.5 10*3/uL — ABNORMAL HIGH (ref 0.0–0.5)
Eosinophils Relative: 18 %
HCT: 34.1 % — ABNORMAL LOW (ref 36.0–46.0)
Hemoglobin: 11.3 g/dL — ABNORMAL LOW (ref 12.0–15.0)
Immature Granulocytes: 0 %
Lymphocytes Relative: 29 %
Lymphs Abs: 2.4 10*3/uL (ref 0.7–4.0)
MCH: 27.8 pg (ref 26.0–34.0)
MCHC: 33.1 g/dL (ref 30.0–36.0)
MCV: 83.8 fL (ref 80.0–100.0)
Monocytes Absolute: 0.6 10*3/uL (ref 0.1–1.0)
Monocytes Relative: 7 %
Neutro Abs: 3.8 10*3/uL (ref 1.7–7.7)
Neutrophils Relative %: 46 %
Platelets: 349 10*3/uL (ref 150–400)
RBC: 4.07 MIL/uL (ref 3.87–5.11)
RDW: 13.1 % (ref 11.5–15.5)
WBC: 8.4 10*3/uL (ref 4.0–10.5)
nRBC: 0 % (ref 0.0–0.2)

## 2020-11-22 LAB — BASIC METABOLIC PANEL
Anion gap: 10 (ref 5–15)
BUN: 14 mg/dL (ref 6–20)
CO2: 25 mmol/L (ref 22–32)
Calcium: 9.7 mg/dL (ref 8.9–10.3)
Chloride: 104 mmol/L (ref 98–111)
Creatinine, Ser: 0.83 mg/dL (ref 0.44–1.00)
GFR, Estimated: >60 mL/min (ref >60)
Glucose, Bld: 96 mg/dL (ref 70–99)
Potassium: 3.9 mmol/L (ref 3.5–5.1)
Sodium: 139 mmol/L (ref 135–145)

## 2020-11-22 MED ORDER — IOHEXOL 300 MG/ML  SOLN
75.0000 mL | Freq: Once | INTRAMUSCULAR | Status: AC | PRN
  Administered 2020-11-22: 75 mL via INTRAVENOUS

## 2020-11-22 MED ORDER — HEPARIN SOD (PORK) LOCK FLUSH 100 UNIT/ML IV SOLN
500.0000 [IU] | Freq: Once | INTRAVENOUS | Status: AC
  Administered 2020-11-22: 500 [IU]
  Filled 2020-11-22: qty 5

## 2020-11-22 MED ORDER — ONDANSETRON HCL 4 MG/2ML IJ SOLN
4.0000 mg | Freq: Once | INTRAMUSCULAR | Status: AC
  Administered 2020-11-22: 4 mg via INTRAVENOUS
  Filled 2020-11-22: qty 2

## 2020-11-22 MED ORDER — FENTANYL CITRATE (PF) 100 MCG/2ML IJ SOLN
100.0000 ug | Freq: Once | INTRAMUSCULAR | Status: AC
  Administered 2020-11-22: 100 ug via INTRAVENOUS
  Filled 2020-11-22: qty 2

## 2020-11-22 MED ORDER — FENTANYL CITRATE (PF) 100 MCG/2ML IJ SOLN
100.0000 ug | Freq: Once | INTRAMUSCULAR | Status: AC
Start: 2020-11-22 — End: 2020-11-22
  Administered 2020-11-22: 100 ug via INTRAVENOUS
  Filled 2020-11-22: qty 2

## 2020-11-22 NOTE — ED Notes (Signed)
Port de-accessed using heparin flush per protocol.  Tolerated well.

## 2020-12-16 ENCOUNTER — Encounter (HOSPITAL_BASED_OUTPATIENT_CLINIC_OR_DEPARTMENT_OTHER): Payer: Self-pay

## 2020-12-16 ENCOUNTER — Other Ambulatory Visit: Payer: Self-pay

## 2020-12-16 ENCOUNTER — Emergency Department (HOSPITAL_BASED_OUTPATIENT_CLINIC_OR_DEPARTMENT_OTHER)
Admission: EM | Admit: 2020-12-16 | Discharge: 2020-12-16 | Disposition: A | Discharge status: Home / Self Care | Payer: No Typology Code available for payment source | Attending: Emergency Medicine | Admitting: Emergency Medicine

## 2020-12-16 DIAGNOSIS — Y848 Other medical procedures as the cause of abnormal reaction of the patient, or of later complication, without mention of misadventure at the time of the procedure: Secondary | ICD-10-CM | POA: Diagnosis not present

## 2020-12-16 DIAGNOSIS — T8131XA Disruption of external operation (surgical) wound, not elsewhere classified, initial encounter: Secondary | ICD-10-CM | POA: Insufficient documentation

## 2020-12-16 DIAGNOSIS — T8130XA Disruption of wound, unspecified, initial encounter: Secondary | ICD-10-CM

## 2020-12-16 DIAGNOSIS — Z85828 Personal history of other malignant neoplasm of skin: Secondary | ICD-10-CM | POA: Insufficient documentation

## 2020-12-16 NOTE — Discharge Instructions (Addendum)
Change her dressing daily.  Keep the wound clean and dry.  Call today for a follow-up appointment with Dr. Georgina Snell office this week.  Return to emergency room if you have any worsening drainage, pain to the area, fevers or other worsening symptoms.

## 2020-12-16 NOTE — ED Provider Notes (Signed)
[place] EMERGENCY DEPARTMENT Provider Note   CSN: [id number] Arrival date & time: 12/16/20  0736     History Chief Complaint  Patient presents with   Wound Check    Briana Powers is a 30 y.o. female.  Patient is a 30 year old female who presents with an wound opening to her lower abdominal wall.  She had surgery by Dr. Clovis Riley with [facility] on August 8 to remove a pelvic mass.  She has had recurrent colorectal cancer.  She has had a prior partial colectomy with colostomy.  She said that she last saw Dr. Clovis Riley on August 26 and her wound was doing well.  She had a small fluid collection in the lower end of her abdominal incision.  She said it looks like it had been a bubble there for the last 2 weeks.  She said during the night it opened up and she now has a small hole at the lower end of the incision.  She denies any increased pain.  No fevers.  No purulent drainage.      Past Medical History:  Diagnosis Date   Cancer [facility])    Family history of colon cancer    Mass of colon    Polyposis of colon    PUD (peptic ulcer disease)     Patient Active Problem List   Diagnosis Date Noted   Genetic testing 03/08/2019   Familial adenomatous polyposis 03/08/2019   Family history of colon cancer    Polyposis of colon    Mass of colon    Symptomatic anemia 02/14/2019    Past Surgical History:  Procedure Laterality Date   ABDOMINAL HYSTERECTOMY     APPENDECTOMY     BIOPSY  02/18/2019   Procedure: BIOPSY;  Surgeon: Ronnette Juniper, MD;  Location: [facility];  Service: Gastroenterology;;   [place] N/A 02/18/2019   Procedure: Beryle Quant;  Surgeon: Ronnette Juniper, MD;  Location: [facility];  Service: Gastroenterology;  Laterality: N/A;   HERNIA REPAIR     TUBAL LIGATION       OB History     Gravida  2   Para  1   Term  1   Preterm  0   AB  0   Living  1      SAB  0   IAB  0   Ectopic  0   Multiple  0    Live Births              Family History  Problem Relation Age of Onset   Colon cancer Mother 80       d. 25s   Colon cancer Brother        dx in his 50s   Healthy Sister        normal colonoscopy   Colon cancer Father        d. late 9s-50s   Colon cancer Maternal Grandfather        possible colon cancer   Colon cancer Paternal Grandfather        ?? possible colon cancer    Social History   Tobacco Use   Smoking status: Never   Smokeless tobacco: Never  Substance Use Topics   Alcohol use: Not Currently   Drug use: Never    Home Medications Prior to Admission medications   Medication Sig Start Date End Date Taking? Authorizing Provider  dicyclomine (BENTYL) 20 MG tablet Take 1 tablet (20  mg total) by mouth every 6 (six) hours. Patient taking differently: Take 20 mg by mouth every 6 (six) hours as needed for spasms.  03/08/19   Volanda Napoleon, MD  HYDROcodone-acetaminophen (NORCO) 5-325 MG tablet Take 1-2 tablets by mouth every 6 (six) hours as needed. 03/13/19   Veryl Speak, MD  oxyCODONE (OXY IR/ROXICODONE) 5 MG immediate release tablet Take by mouth. 09/09/19   [provider]  polyethylene glycol (MIRALAX / GLYCOLAX) 17 g packet Take 17 g by mouth daily. Patient taking differently: Take 17 g by mouth daily as needed for mild constipation.  02/24/19   Shelly Coss, MD    Allergies    Patient has no known allergies.  Review of Systems   Review of Systems  Constitutional:  Negative for chills, diaphoresis, fatigue and fever.  HENT:  Negative for congestion, rhinorrhea and sneezing.   Eyes: Negative.   Respiratory:  Negative for cough, chest tightness and shortness of breath.   Cardiovascular:  Negative for chest pain and leg swelling.  Gastrointestinal:  Positive for abdominal pain (Postsurgical pain). Negative for blood in stool, diarrhea, nausea and vomiting.  Genitourinary:  Negative for difficulty urinating, flank pain, frequency and hematuria.   Musculoskeletal:  Negative for arthralgias and back pain.  Skin:  Positive for wound. Negative for rash.  Neurological:  Negative for dizziness, speech difficulty, weakness, numbness and headaches.   Physical Exam Updated Vital Signs BP 109/71 (BP Location: Right Arm)   Pulse 71   Temp 98.7 F (37.1 C) (Oral)   Resp 16   Ht '5\' 3"'$  (1.6 m)   Wt 63.5 kg   LMP 02/28/2019   SpO2 100%   BMI 24.80 kg/m   Physical Exam Constitutional:      Appearance: She is well-developed.  HENT:     Head: Normocephalic and atraumatic.  Eyes:     Pupils: Pupils are equal, round, and reactive to light.  Cardiovascular:     Rate and Rhythm: Normal rate and regular rhythm.     Heart sounds: Normal heart sounds.  Pulmonary:     Effort: Pulmonary effort is normal. No respiratory distress.     Breath sounds: Normal breath sounds. No wheezing or rales.  Chest:     Chest wall: No tenderness.  Abdominal:     General: Bowel sounds are normal.     Palpations: Abdomen is soft.     Tenderness: There is no abdominal tenderness. There is no guarding or rebound.     Comments: Patient has a healing midline Incision to her lower abdominal wall.  There is a small area of dehiscence to the inferior aspect of the incision.  There is no purulent drainage.  No warmth or erythema.  No increased pain to the area.  Musculoskeletal:        General: Normal range of motion.     Cervical back: Normal range of motion and neck supple.  Lymphadenopathy:     Cervical: No cervical adenopathy.  Skin:    General: Skin is warm and dry.     Findings: No rash.  Neurological:     Mental Status: She is alert and oriented to person, place, and time.    ED Results / Procedures / Treatments   Labs (all labs ordered are listed, but only abnormal results are displayed) Labs Reviewed - No data to display  EKG None  Radiology No results found.  Procedures Procedures   Medications Ordered in ED Medications - No data to  display  ED Course  I have reviewed the triage vital signs and the nursing notes.  Pertinent labs & imaging results that were available during my care of the patient were reviewed by me and considered in my medical decision making (see chart for details).    MDM Rules/Calculators/A&P                           Patient is a 30 year old female who presents with a small opening in her incisional wound.  It appears that likely there was a seroma there that opened up.  There is a small, less than 1 cm wound opening.  There is no surrounding signs of infection.  I spoke with Dr. Clovis Riley who recommends dressing changes and have the patient follow-up in their office at the end of this week.  She will call the office for follow-up.  Return precautions were given. Final Clinical Impression(s) / ED Diagnoses Final diagnoses:  Wound dehiscence    Rx / DC Orders ED Discharge Orders     None        Malvin Johns, MD 12/16/20 [phone]

## 2020-12-16 NOTE — ED Triage Notes (Signed)
Pt arrives with reports of open wound states that she had a surgery on August the 8th, reports the incision area has opened up and started draining last night.

## 2021-04-16 IMAGING — US US PELVIS COMPLETE WITH TRANSVAGINAL
1 series · 13 of 25 positions shown · non-contrast
Comparison: CT enterography abdomen and pelvis, 02/15/2019

CLINICAL DATA: Evaluate right adnexal mass

EXAM:
TRANSABDOMINAL AND TRANSVAGINAL ULTRASOUND OF PELVIS
DOPPLER ULTRASOUND OF OVARIES
TECHNIQUE: Both transabdominal and transvaginal ultrasound examinations of the
pelvis were performed. Transabdominal technique was performed for
global imaging of the pelvis including uterus, ovaries, adnexal
regions, and pelvic cul-de-sac.
It was necessary to proceed with endovaginal exam following the
transabdominal exam to visualize the ovaries and adnexa. Color and
duplex Doppler ultrasound was utilized to evaluate blood flow to the
ovaries.

[Series 1: us pelvis complete with transvaginal · 74 acquisitions, 13 frames shown]
[im 1/74]
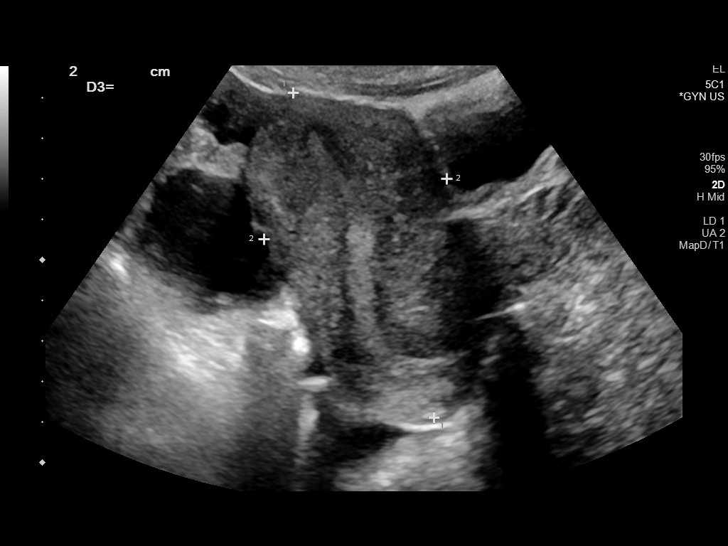
[im 7/74]
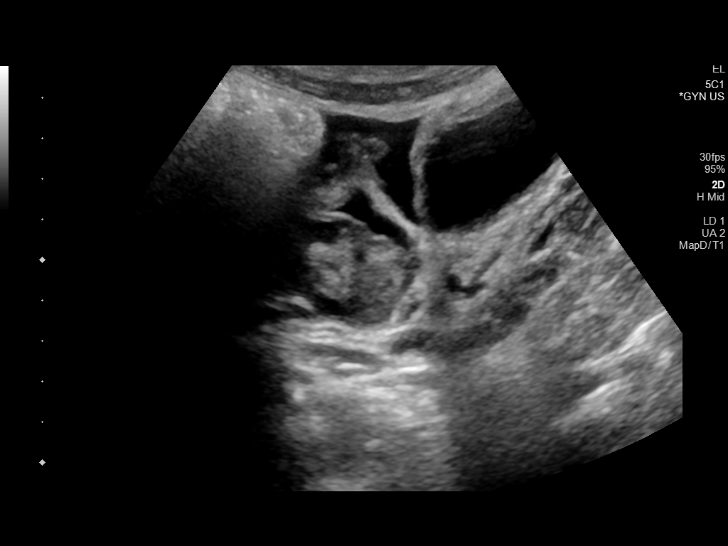
[im 13/74]
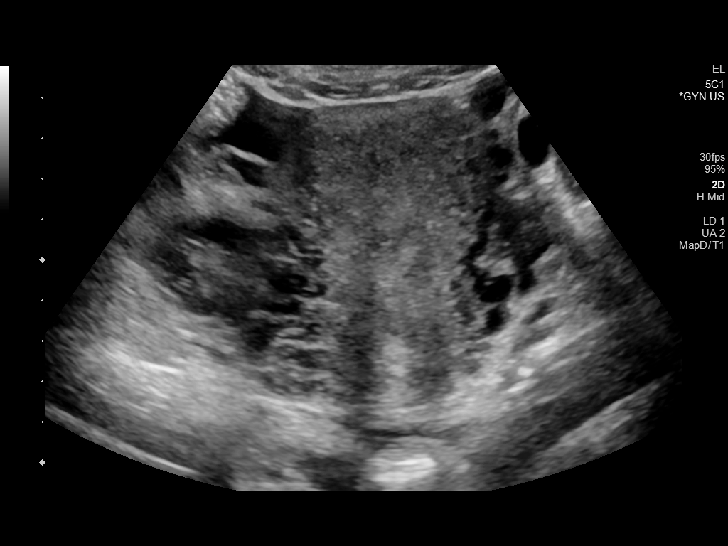
[im 19/74]
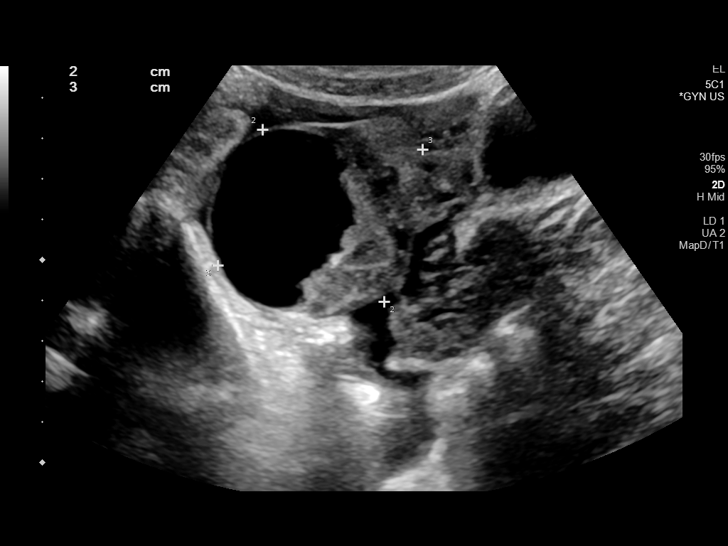
[im 25/74]
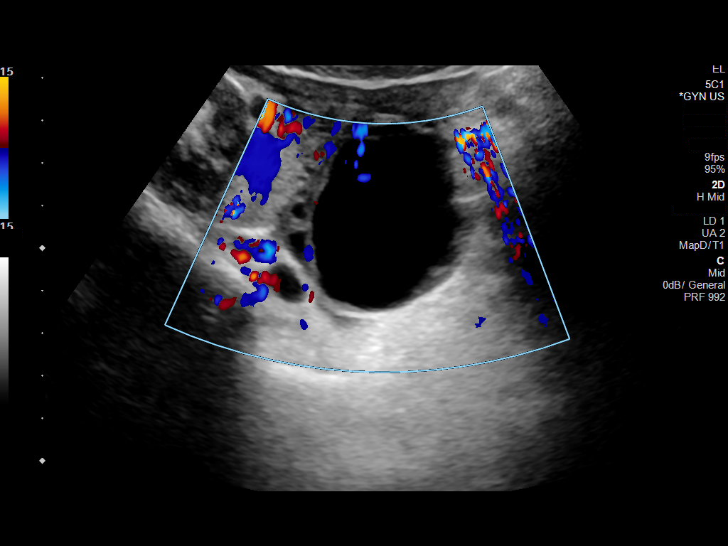
[im 31/74]
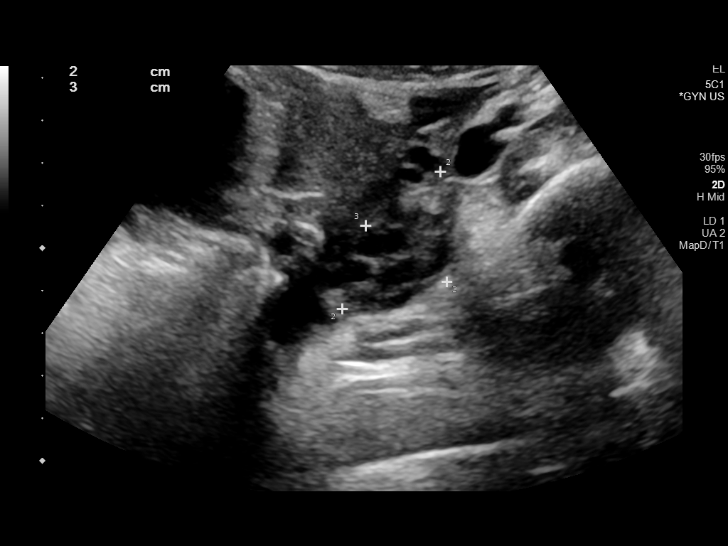
[im 37/74]
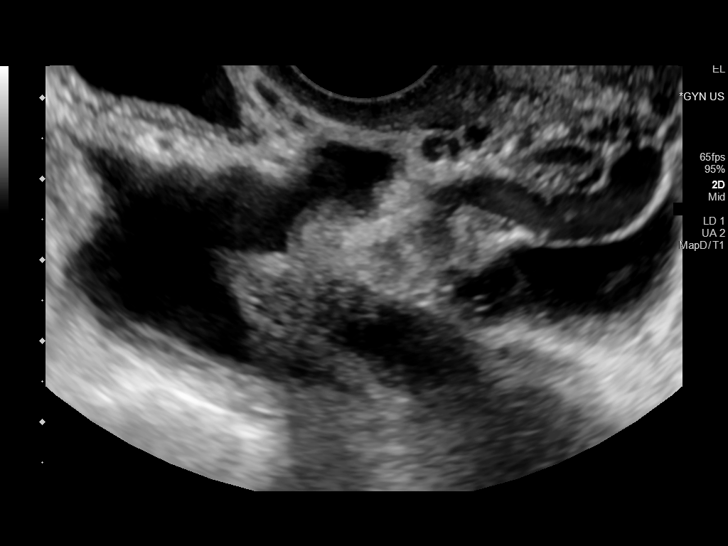
[im 43/74]
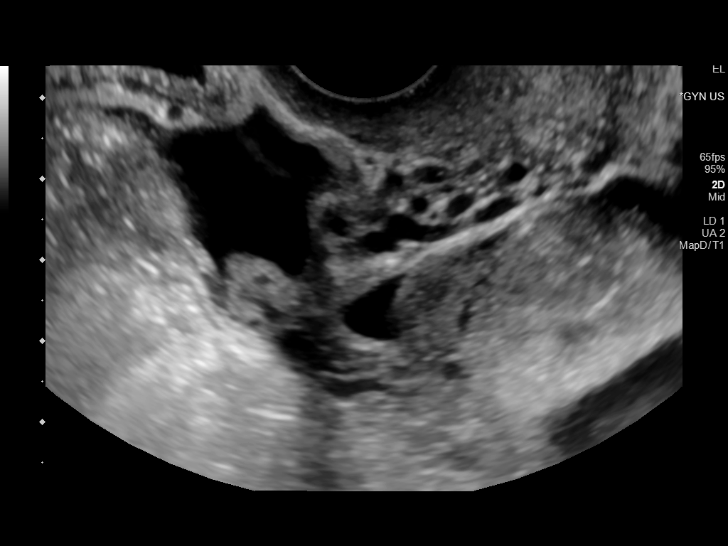
[im 49/74]
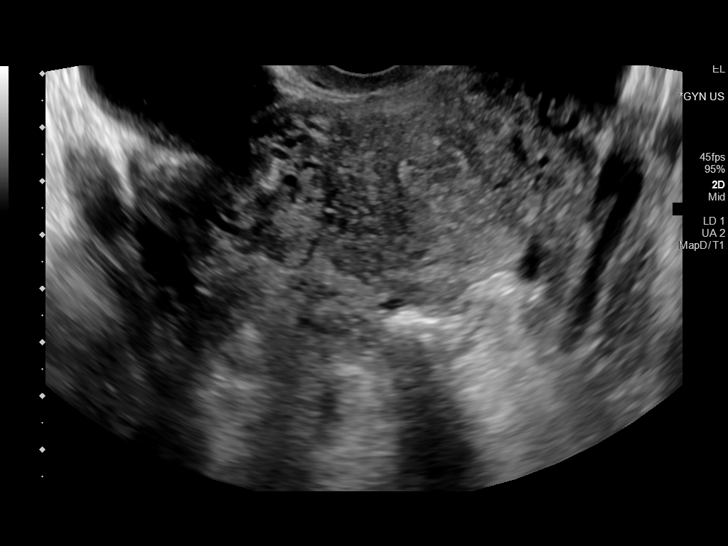
[im 55/74]
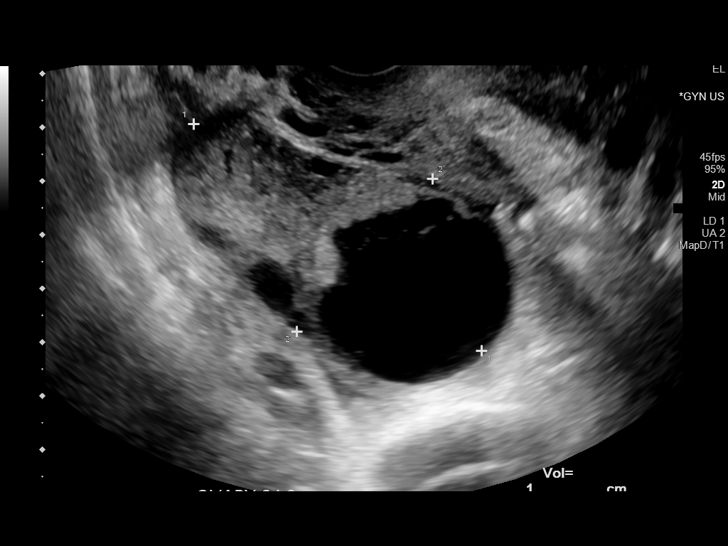
[im 61/74]
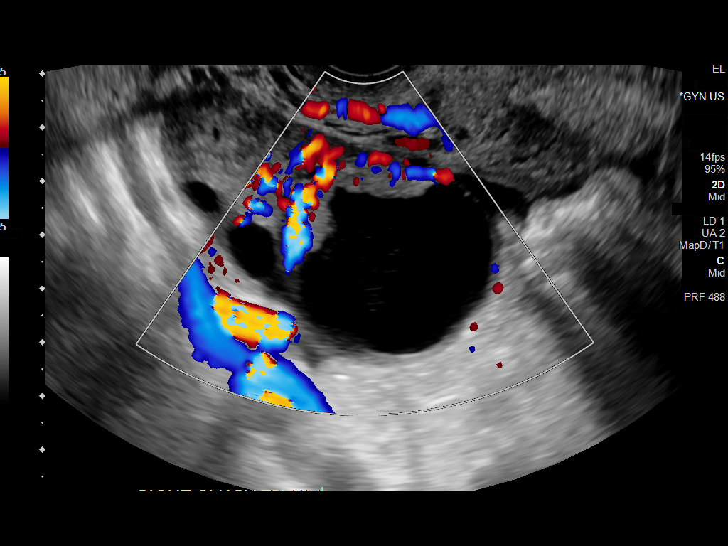
[im 67/74]
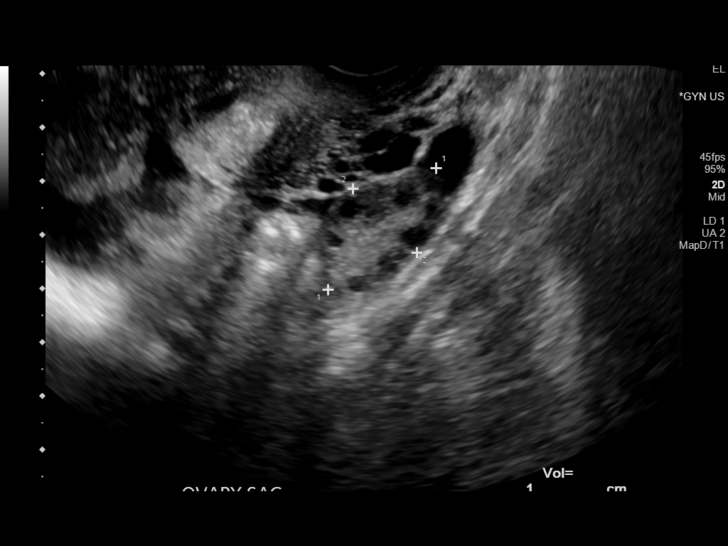
[im 74/74]
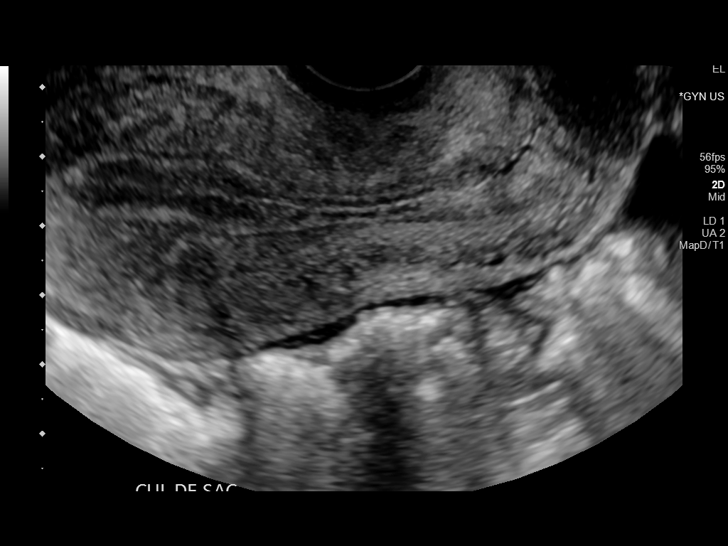

[13 of 25 positions shown; findings below may reference images not displayed]

FINDINGS: Uterus

Measurements: 8.7 x 4.8 x 5.5 cm = volume: 119 mL. No fibroids or
other mass visualized.

Endometrium

Thickness: 8 mm.  No focal abnormality visualized.

Right ovary

Measurements: 5.8 x 5.2 x 4.9 cm = volume: 79 mL. The right ovary is
enlarged by a dominant cyst measuring 3.9 x 3.5 x 3.5 cm with
multiple smaller cysts and follicles.

Left ovary

Measurements: 4.0 x 2.3 x 2.1 cm = volume: 10 mL. Multiple small
follicles.

Pulsed Doppler evaluation of both ovaries demonstrates normal
low-resistance arterial and venous waveforms.

Other findings

Trace free fluid in the pelvis.
IMPRESSION: 1. The right ovary is enlarged by a dominant cyst measuring 3.9 cm,
in keeping with prior CT findings and almost certainly a benign
functional cyst/follicular remnant in the reproductive age setting.
Consider follow-up ultrasound at 6 weeks to assess for stability or
resolution.

2. Arterial and venous Doppler flow are present to the ovaries
bilaterally, however intermittent or incomplete ovarian torsion
cannot be strictly excluded in any enlarged ovary if there is
acutely referable pain.

3.  Trace, nonspecific free fluid in the pelvis.

## 2023-01-21 IMAGING — CT CT ABD-PELV W/ CM
2 of 5 series · 15 of 46 positions shown, 17 images · IV contrast (Omnipaque)
Comparison: 06/24/2020

CLINICAL DATA: Abdominal pain, hernia suspected. Postoperative
pain. New onset pain after abdominal wall resection with Nash
11/09/2020

EXAM:
CT ABDOMEN AND PELVIS WITH CONTRAST
TECHNIQUE: Multidetector CT imaging of the abdomen and pelvis was performed
using the standard protocol following bolus administration of
intravenous contrast.
CONTRAST:  75mL OMNIPAQUE IOHEXOL 300 MG/ML  SOLN

[Series 2: axial st · axial · 0.77mm/px · z∈[-500,-80]mm · 12 of 99 slices shown, 14 images]
[im 8/99  soft-tissue]
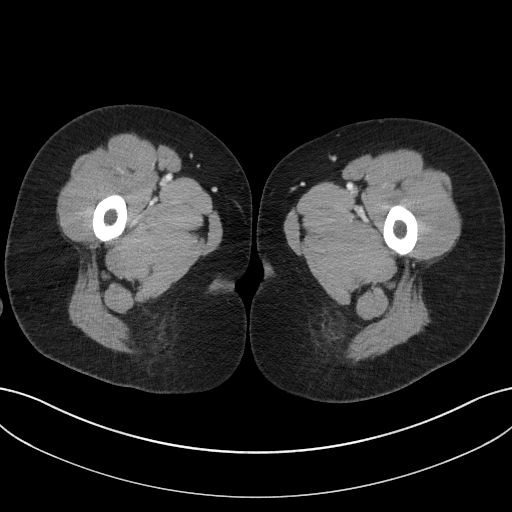
[im 8/99  bone]
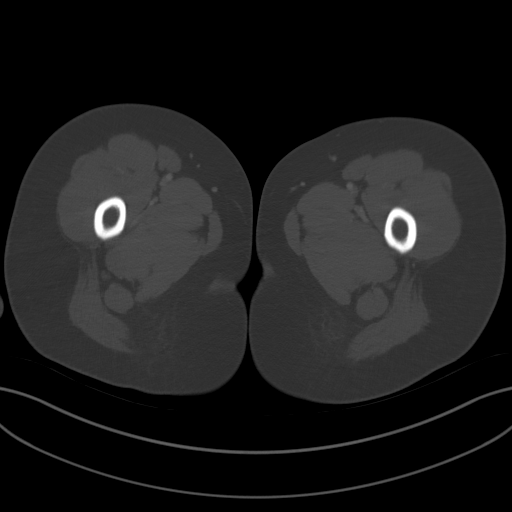
[im 15/99  soft-tissue]
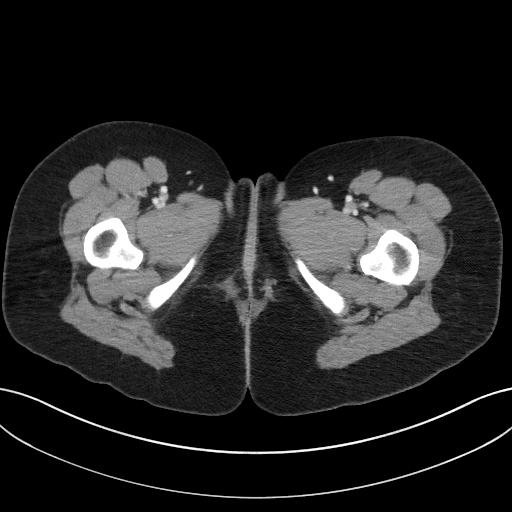
[im 22/99  soft-tissue]
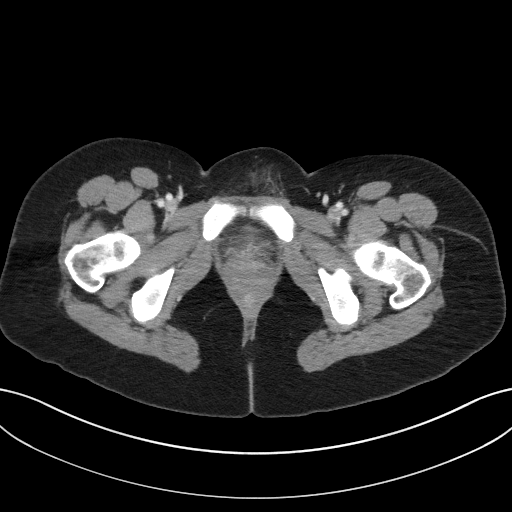
[im 29/99  soft-tissue]
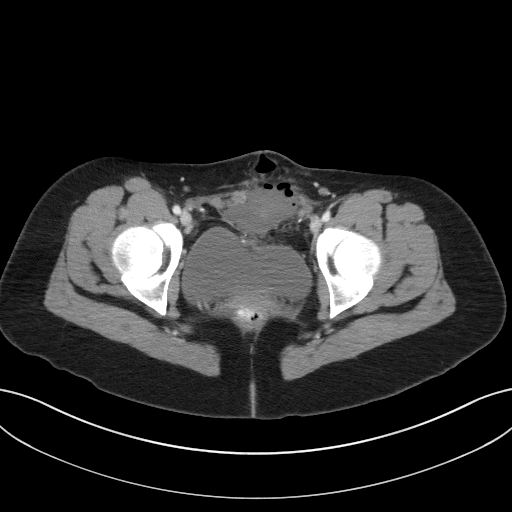
[im 36/99  soft-tissue]
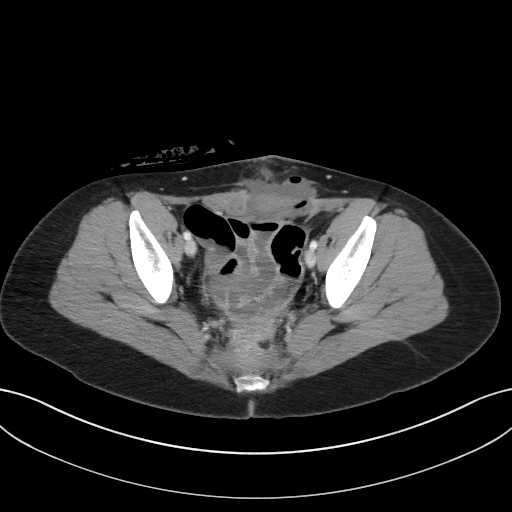
[im 43/99  soft-tissue]
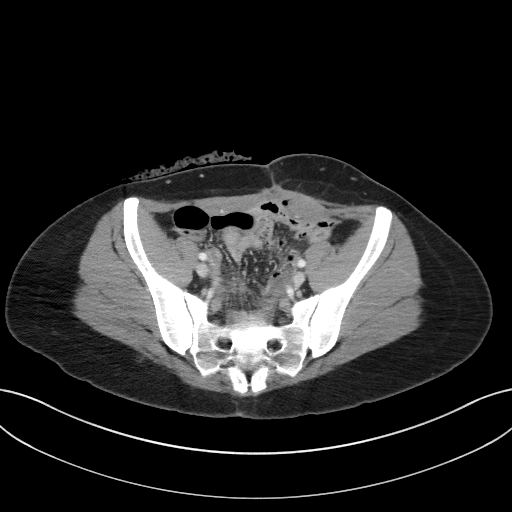
[im 57/99  soft-tissue]
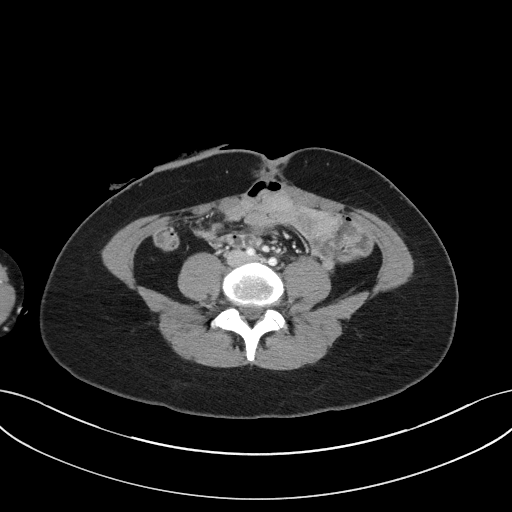
[im 64/99  soft-tissue]
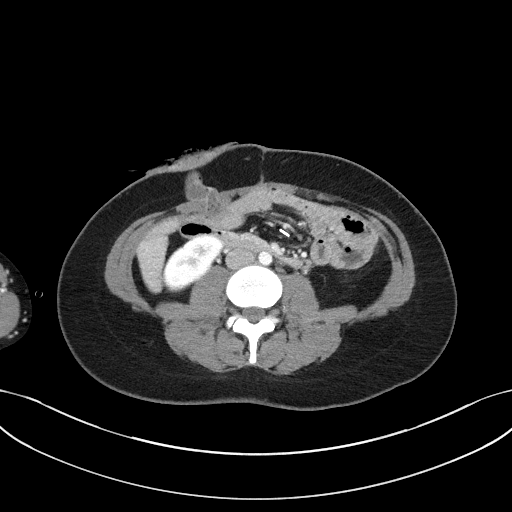
[im 71/99  soft-tissue]
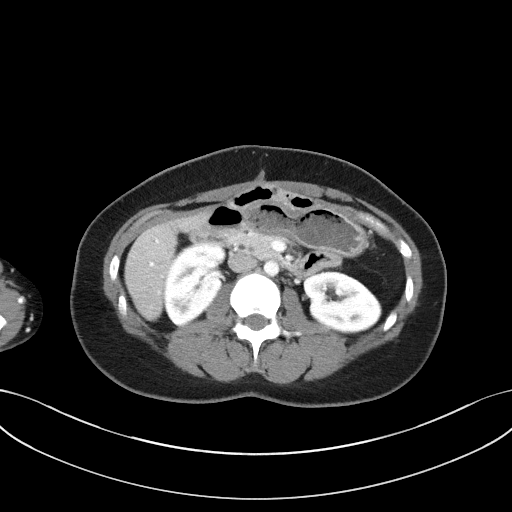
[im 71/99  bone]
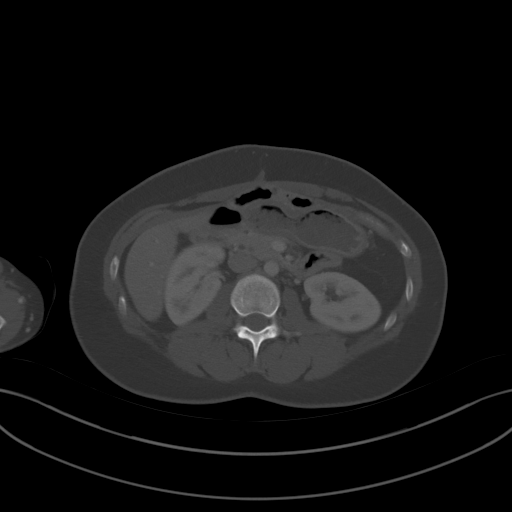
[im 78/99  soft-tissue]
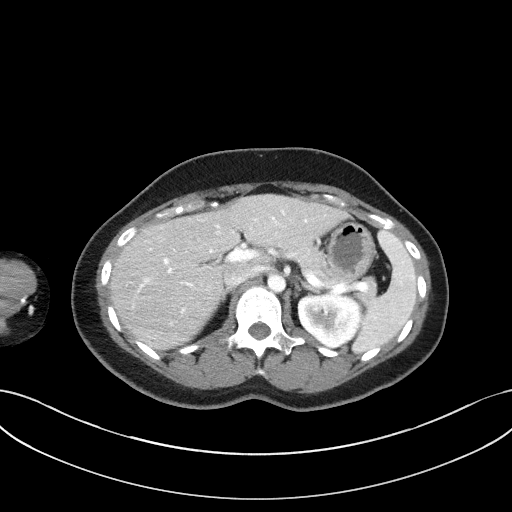
[im 85/99  soft-tissue]
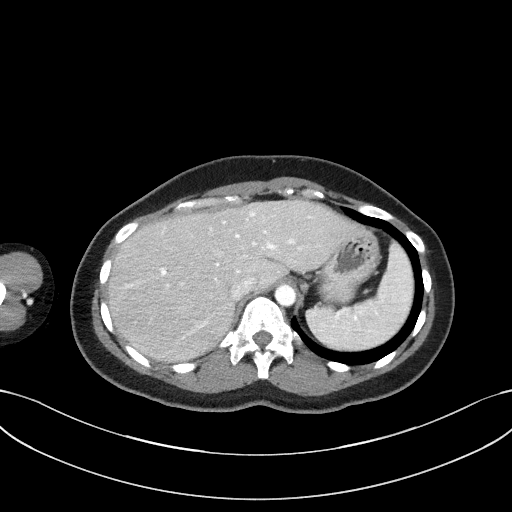
[im 92/99  soft-tissue]
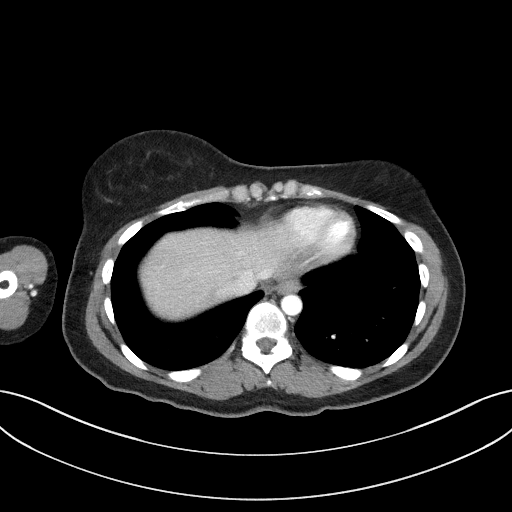

[Series 5: coronal st · coronal · 0.73mm/px · 3 of 72 slices shown]
[im 24/72  soft-tissue]
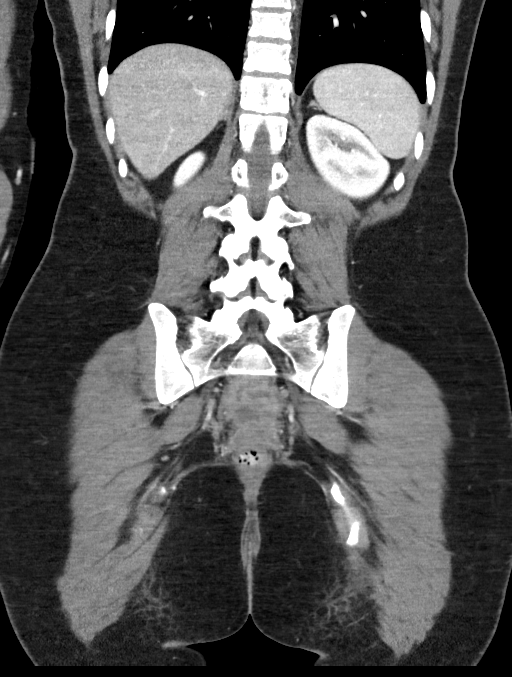
[im 32/72  soft-tissue]
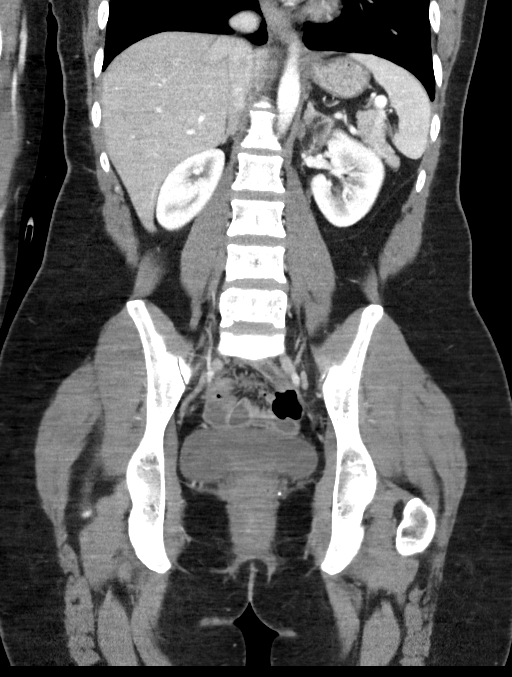
[im 40/72  soft-tissue]
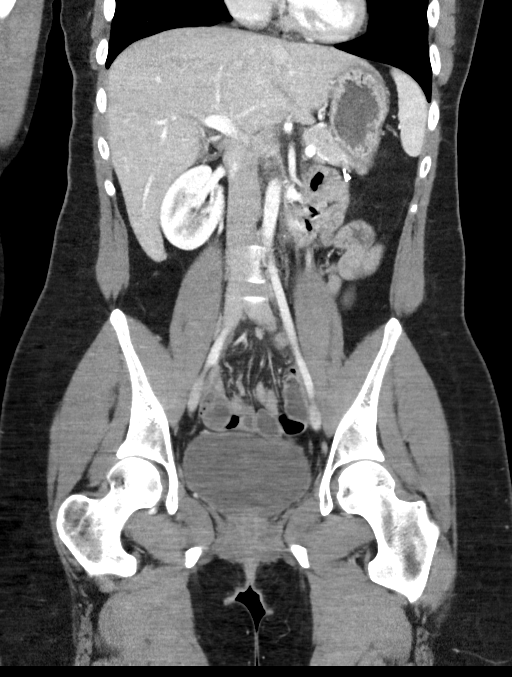

[15 of 46 positions shown; findings below may reference images not displayed]

FINDINGS: Lower chest: The lung bases are clear.

Hepatobiliary: No focal liver lesions. Portal veins are patent. No
bile duct dilatation. Gallbladder is not visualized, likely
contracted or resected.

Pancreas: Unremarkable. No pancreatic ductal dilatation or
surrounding inflammatory changes.

Spleen: Normal in size without focal abnormality.

Adrenals/Urinary Tract: Adrenal glands are unremarkable. Kidneys are
normal, without renal calculi, focal lesion, or hydronephrosis.
Bladder is unremarkable.

Stomach/Bowel: Colonic resection with right lower quadrant
ileostomy. Surgical clips in the mesentery. No bowel dilatation or
wall thickening appreciated. There appears to be a ventral abdominal
wall hernia at the midline near the umbilicus with possible
tethering of bowel loops. No proximal obstruction. Presacral soft
tissue likely representing postoperative change and similar to prior
study.

Vascular/Lymphatic: No significant vascular findings are present. No
enlarged abdominal or pelvic lymph nodes.

Reproductive: Uterus and bilateral adnexa are unremarkable.

Other: No free air or free fluid in the abdomen. Postoperative
scarring in the anterior abdominal wall. In the anterior wall of the
pelvis, there is a somewhat amorphous poorly delineated fluid and
gas collection which is centered at the rectus abdominus muscles and
measures about 3.6 x 6.4 cm. This is likely a postoperative
collection, possibly abscess. Soft tissue gas extends out from this
collection to the skin surface, possibly indicating a cutaneous
fistula.

Musculoskeletal: No destructive bone lesions.
IMPRESSION: 1. New finding of a fluid and gas collection in the low pelvic wall
anteriorly extending from the subcutaneous fat into the rectus
abdominus muscles. This likely represents a postoperative
collection, possibly abscess. There appears to be a cutaneous
fistula.
2. Previous postoperative change with colectomy and right lower
quadrant ileostomy. Presacral soft tissue density appears similar to
prior study, likely postoperative.
3. Scarring in the anterior abdominal wall with ventral hernia and
tethering of bowel. No proximal obstruction.
